# Patient Record
Sex: Female | Born: 1985 | Race: White | Hispanic: No | Smoking: Current every day smoker
Health system: Southern US, Community
[De-identification: ages and names within clinical notes are randomized; demographics above are authoritative.]

## PROBLEM LIST (undated history)

## (undated) DIAGNOSIS — R87619 Unspecified abnormal cytological findings in specimens from cervix uteri: Secondary | ICD-10-CM

## (undated) DIAGNOSIS — F32A Depression, unspecified: Secondary | ICD-10-CM

## (undated) DIAGNOSIS — R112 Nausea with vomiting, unspecified: Secondary | ICD-10-CM

## (undated) DIAGNOSIS — IMO0002 Reserved for concepts with insufficient information to code with codable children: Secondary | ICD-10-CM

## (undated) DIAGNOSIS — F329 Major depressive disorder, single episode, unspecified: Secondary | ICD-10-CM

## (undated) DIAGNOSIS — Z8759 Personal history of other complications of pregnancy, childbirth and the puerperium: Secondary | ICD-10-CM

## (undated) DIAGNOSIS — Z8742 Personal history of other diseases of the female genital tract: Secondary | ICD-10-CM

## (undated) DIAGNOSIS — Z8619 Personal history of other infectious and parasitic diseases: Secondary | ICD-10-CM

## (undated) DIAGNOSIS — Z9889 Other specified postprocedural states: Secondary | ICD-10-CM

## (undated) HISTORY — PX: WISDOM TOOTH EXTRACTION: SHX21

## (undated) HISTORY — PX: EYE SURGERY: SHX253

## (undated) HISTORY — DX: Personal history of other diseases of the female genital tract: Z87.42

## (undated) HISTORY — DX: Personal history of other complications of pregnancy, childbirth and the puerperium: Z87.59

## (undated) HISTORY — DX: Personal history of other infectious and parasitic diseases: Z86.19

---
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
## 2003-04-30 ENCOUNTER — Emergency Department (HOSPITAL_COMMUNITY): Admission: EM | Admit: 2003-04-30 | Discharge: 2003-04-30 | Payer: Self-pay | Admitting: Emergency Medicine

## 2003-10-28 ENCOUNTER — Emergency Department (HOSPITAL_COMMUNITY): Admission: EM | Admit: 2003-10-28 | Discharge: 2003-10-28 | Payer: Self-pay | Admitting: Emergency Medicine

## 2006-12-07 ENCOUNTER — Emergency Department (HOSPITAL_COMMUNITY): Admission: EM | Admit: 2006-12-07 | Discharge: 2006-12-07 | Payer: Self-pay | Admitting: Emergency Medicine

## 2009-06-23 ENCOUNTER — Inpatient Hospital Stay (HOSPITAL_COMMUNITY): Admission: AD | Admit: 2009-06-23 | Discharge: 2009-06-23 | Payer: Self-pay | Admitting: Obstetrics and Gynecology

## 2009-07-28 ENCOUNTER — Inpatient Hospital Stay (HOSPITAL_COMMUNITY): Admission: AD | Admit: 2009-07-28 | Discharge: 2009-07-29 | Payer: Self-pay | Admitting: Obstetrics and Gynecology

## 2009-09-10 ENCOUNTER — Inpatient Hospital Stay (HOSPITAL_COMMUNITY): Admission: AD | Admit: 2009-09-10 | Discharge: 2009-09-10 | Payer: Self-pay | Admitting: Obstetrics and Gynecology

## 2009-09-10 ENCOUNTER — Inpatient Hospital Stay (HOSPITAL_COMMUNITY): Admission: AD | Admit: 2009-09-10 | Discharge: 2009-09-12 | Payer: Self-pay | Admitting: Obstetrics and Gynecology

## 2010-05-30 LAB — CBC
HCT: 32.8 % — ABNORMAL LOW (ref 36.0–46.0)
HCT: 40.1 % (ref 36.0–46.0)
Hemoglobin: 11.6 g/dL — ABNORMAL LOW (ref 12.0–15.0)
Hemoglobin: 14 g/dL (ref 12.0–15.0)
MCH: 38.2 pg — ABNORMAL HIGH (ref 26.0–34.0)
MCH: 37.5 pg — ABNORMAL HIGH (ref 26.0–34.0)
MCHC: 35.5 g/dL (ref 30.0–36.0)
MCHC: 35 g/dL (ref 30.0–36.0)
MCV: 107.6 fL — ABNORMAL HIGH (ref 78.0–100.0)
MCV: 107.1 fL — ABNORMAL HIGH (ref 78.0–100.0)
Platelets: 170 K/uL (ref 150–400)
Platelets: 204 K/uL (ref 150–400)
RBC: 3.05 MIL/uL — ABNORMAL LOW (ref 3.87–5.11)
RBC: 3.74 MIL/uL — ABNORMAL LOW (ref 3.87–5.11)
RDW: 12.8 % (ref 11.5–15.5)
RDW: 12.6 % (ref 11.5–15.5)
WBC: 8.8 K/uL (ref 4.0–10.5)
WBC: 10.9 K/uL — ABNORMAL HIGH (ref 4.0–10.5)

## 2010-08-14 IMAGING — US US OB COMP +14 WK
1 series · 14 of 28 positions shown · non-contrast
Comparison: none

OBSTETRICAL ULTRASOUND:
 This ultrasound exam was performed in the [HOSPITAL] Ultrasound Department.  The OB US report was generated in the AS system, and faxed to the ordering physician.  This report is also available in [HOSPITAL]?s AccessANYware and in [REDACTED] PACS.

[Series 1: us ob comp +14 wk · 0.22mm/px · 14 of 66 slices shown]
[im 3/66]
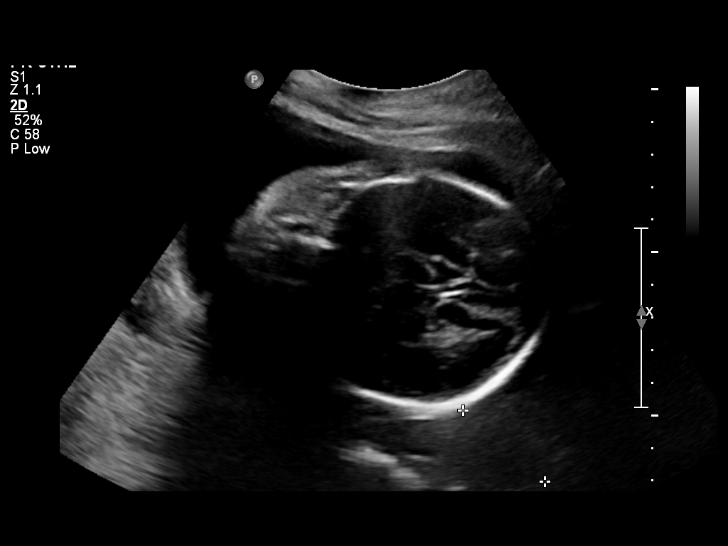
[im 8/66]
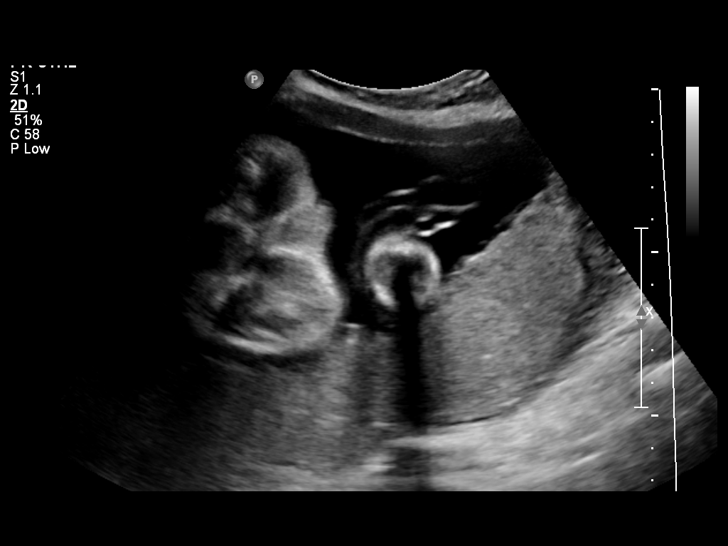
[im 13/66]
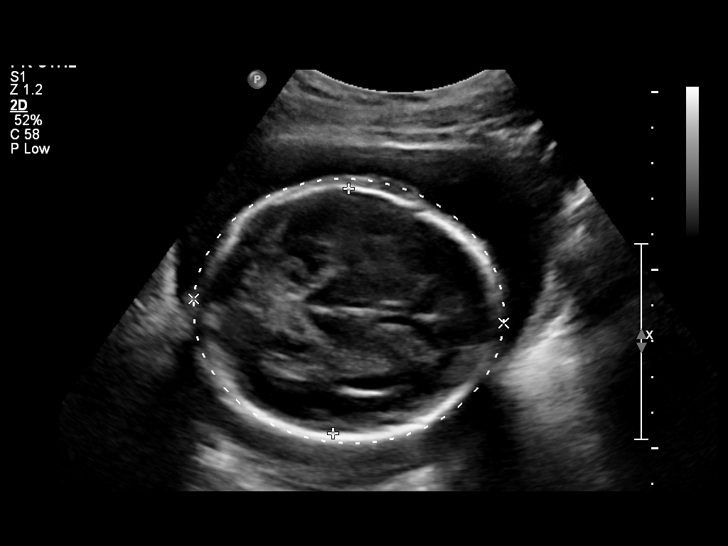
[im 17/66]
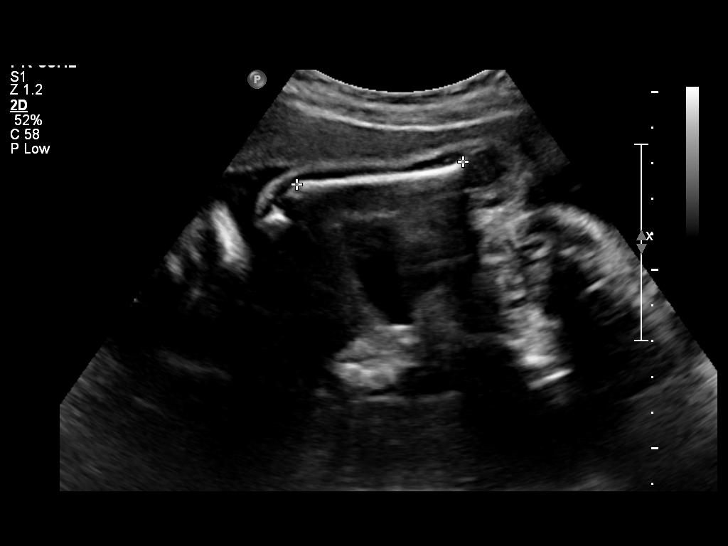
[im 22/66]
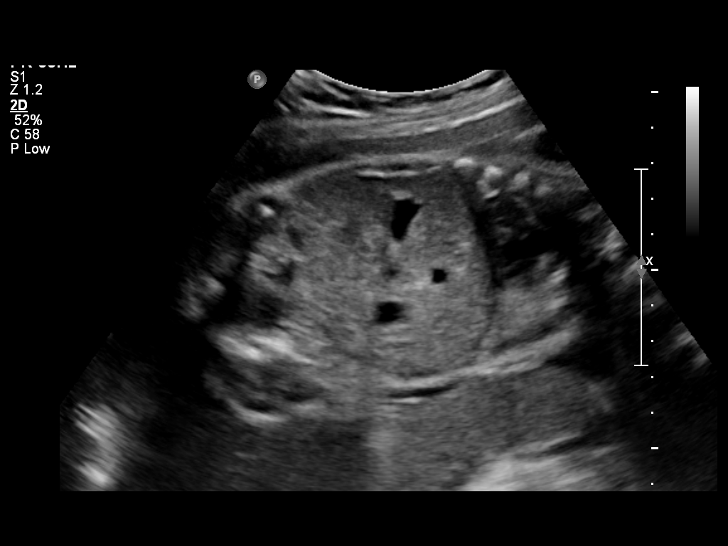
[im 27/66]
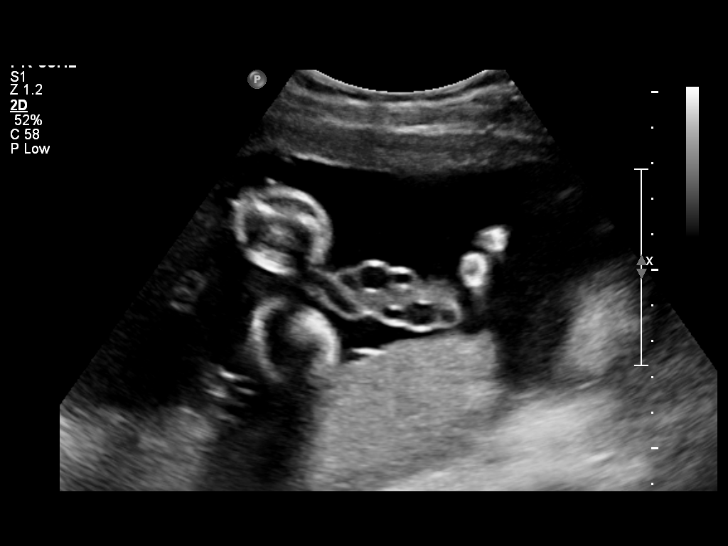
[im 32/66]
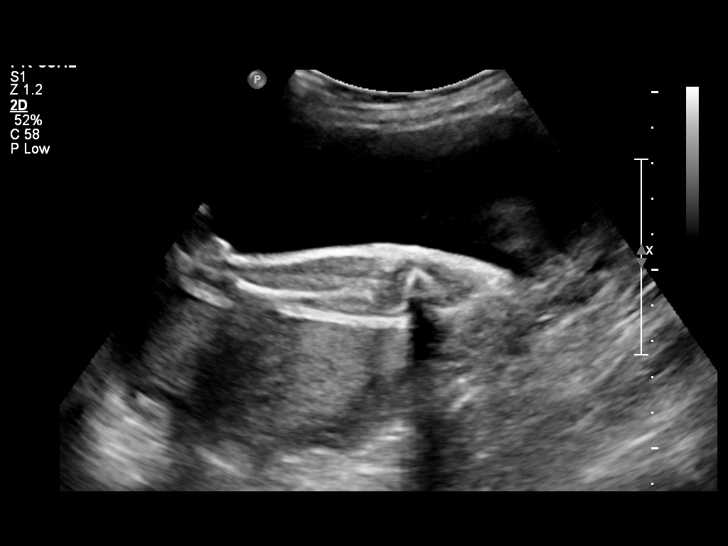
[im 37/66]
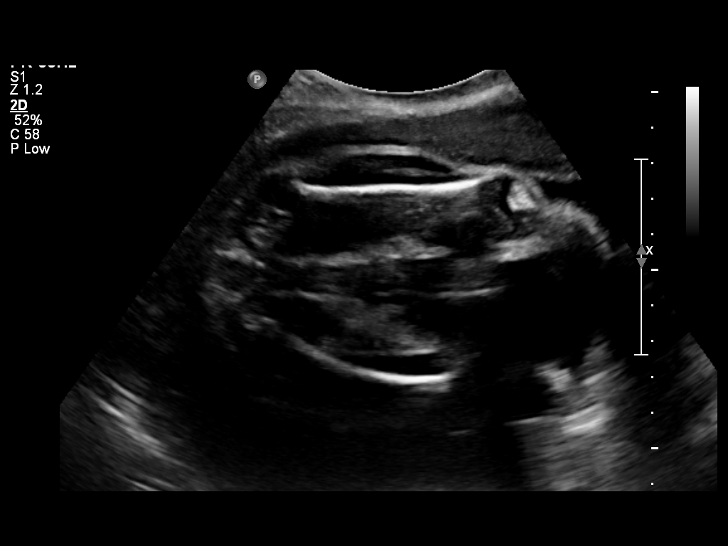
[im 41/66]
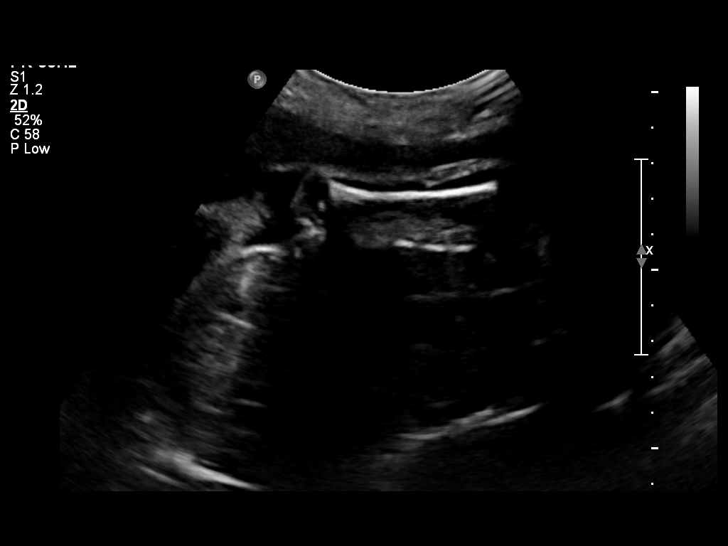
[im 46/66]
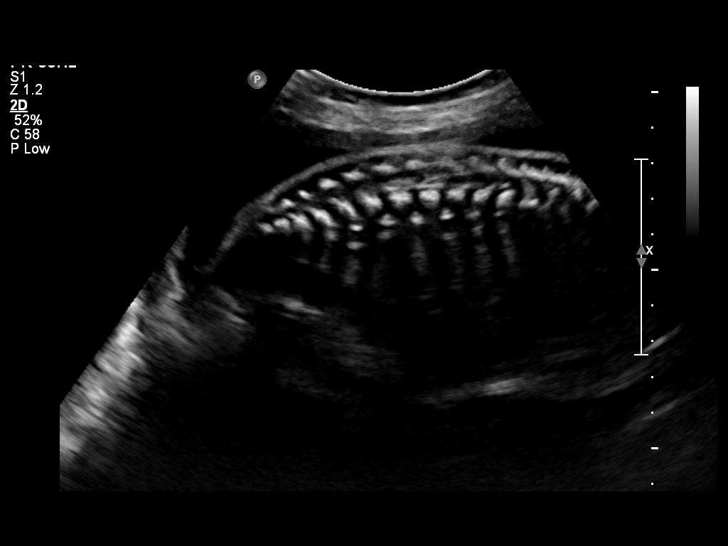
[im 51/66]
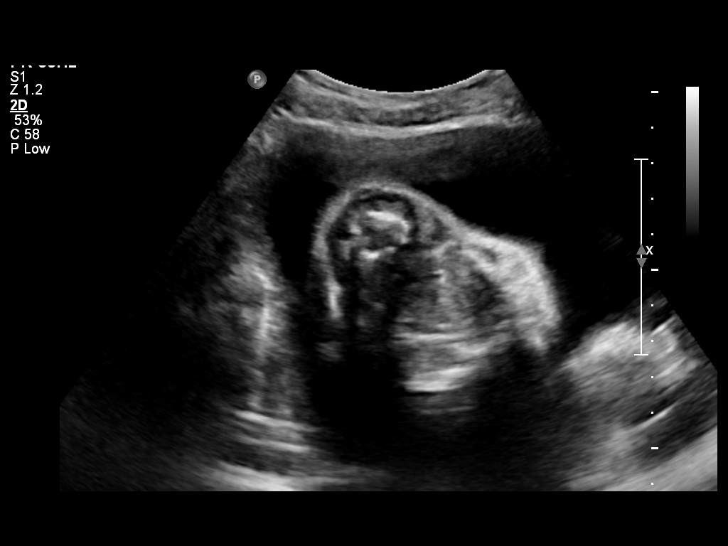
[im 56/66]
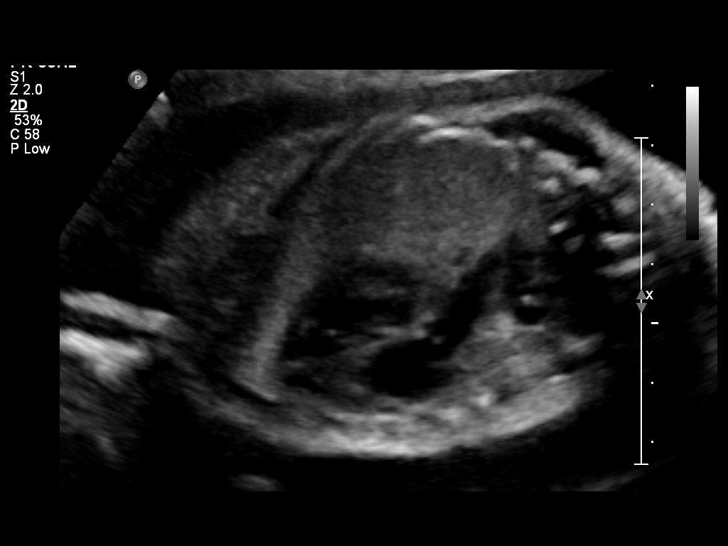
[im 61/66]
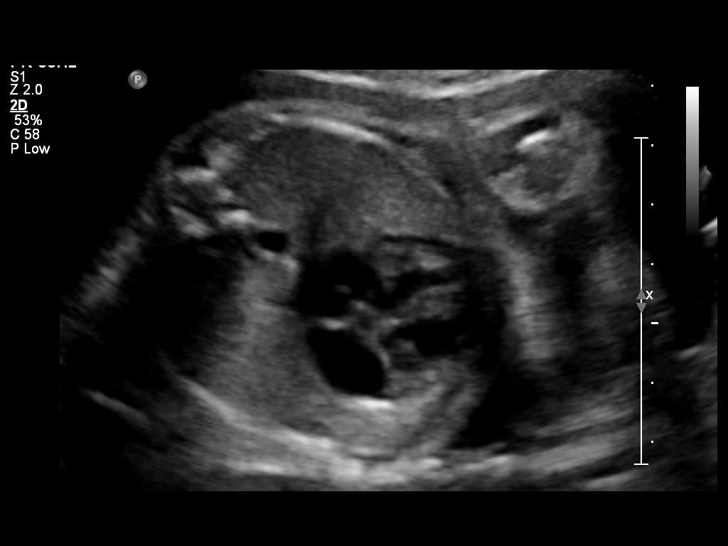
[im 66/66]
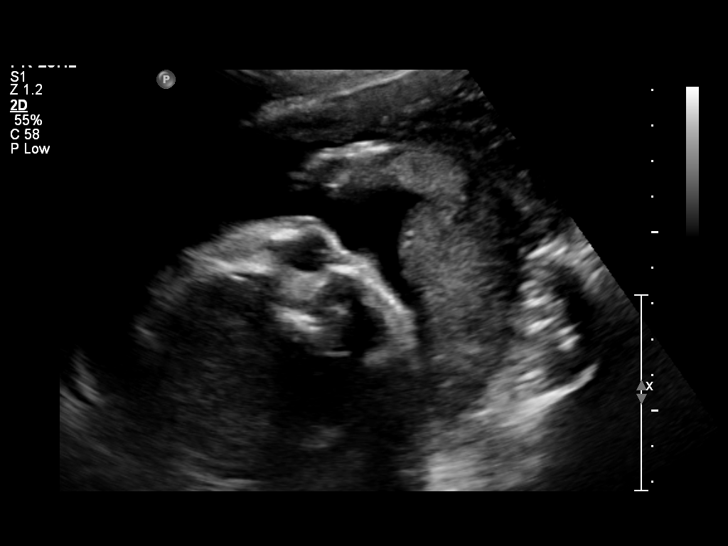

[14 of 28 positions shown; findings below may reference images not displayed]

IMPRESSION: See AS Obstetric US report.

## 2011-01-17 ENCOUNTER — Encounter (HOSPITAL_COMMUNITY): Payer: Self-pay | Admitting: *Deleted

## 2011-01-17 ENCOUNTER — Inpatient Hospital Stay (HOSPITAL_COMMUNITY)
Admission: AD | Admit: 2011-01-17 | Discharge: 2011-01-17 | Disposition: A | Discharge status: Home / Self Care | Payer: Medicaid Other | Source: Ambulatory Visit | Attending: Obstetrics and Gynecology | Admitting: Obstetrics and Gynecology

## 2011-01-17 DIAGNOSIS — O00109 Unspecified tubal pregnancy without intrauterine pregnancy: Secondary | ICD-10-CM | POA: Insufficient documentation

## 2011-01-17 DIAGNOSIS — O009 Unspecified ectopic pregnancy without intrauterine pregnancy: Secondary | ICD-10-CM | POA: Diagnosis present

## 2011-01-17 HISTORY — DX: Reserved for concepts with insufficient information to code with codable children: IMO0002

## 2011-01-17 HISTORY — DX: Unspecified abnormal cytological findings in specimens from cervix uteri: R87.619

## 2011-01-17 HISTORY — DX: Major depressive disorder, single episode, unspecified: F32.9

## 2011-01-17 HISTORY — DX: Depression, unspecified: F32.A

## 2011-01-17 LAB — COMPREHENSIVE METABOLIC PANEL
Albumin: 4.2 g/dL (ref 3.5–5.2)
Alkaline Phosphatase: 61 U/L (ref 39–117)
BUN: 8 mg/dL (ref 6–23)
Chloride: 101 mEq/L (ref 96–112)
GFR calc Af Amer: >90 mL/min (ref >90)
Glucose, Bld: 93 mg/dL (ref 70–99)
Potassium: 4.1 mEq/L (ref 3.5–5.1)
Total Bilirubin: 0.2 mg/dL — ABNORMAL LOW (ref 0.3–1.2)

## 2011-01-17 LAB — CBC
HCT: 37.6 % (ref 36.0–46.0)
Hemoglobin: 12.8 g/dL (ref 12.0–15.0)
MCV: 100 fL (ref 78.0–100.0)
RBC: 3.76 MIL/uL — ABNORMAL LOW (ref 3.87–5.11)
WBC: 9 10*3/uL (ref 4.0–10.5)

## 2011-01-17 MED ORDER — OXYCODONE-ACETAMINOPHEN 5-325 MG PO TABS: 2.0000 | ORAL_TABLET | ORAL | Status: DC | PRN

## 2011-01-17 MED ORDER — OXYCODONE-ACETAMINOPHEN 5-325 MG PO TABS
1.0000 | ORAL_TABLET | Freq: Once | ORAL | Status: DC
  Filled 2011-01-17: qty 1

## 2011-01-17 MED ORDER — METHOTREXATE INJECTION FOR WOMEN'S HOSPITAL
50.0000 mg/m2 | Freq: Once | INTRAMUSCULAR | Status: AC
  Administered 2011-01-17: 75 mg via INTRAMUSCULAR
  Filled 2011-01-17: qty 1.5

## 2011-01-17 NOTE — Progress Notes (Signed)
Pt sent here from office after dx with an ectopic on right tube.  Denies pain currently,  Has brown/red spotting.

## 2011-01-17 NOTE — Progress Notes (Signed)
Bleeding in preg, past wk.  Mild pain off and on, cramping after nurses son.  Pain is now in low back and rt lower front.  Sent from office,  Told ectopic on [place]

## 2011-01-17 NOTE — ED Provider Notes (Signed)
History     Chief Complaint  Patient presents with  . Vaginal Bleeding   Vaginal Bleeding The patient's primary symptoms include pelvic pain and vaginal bleeding. This is a new problem. The current episode started in the past 7 days. The problem occurs intermittently. The problem has been gradually worsening. The pain is moderate. The problem affects the right side. She is pregnant. Associated symptoms include back pain. The vaginal discharge was brown. The vaginal bleeding is spotting. She has not been passing clots. She has not been passing tissue. The symptoms are aggravated by nothing. She has tried NSAIDs for the symptoms. The treatment provided no relief. She uses nothing for contraception. Her past medical history is significant for a Cesarean section.   The patient was seen in the office today for these complaints. Quantitative hCG at 1232. Transvaginal ultrasound revealed a right sided ectopic pregnancy with identified gestational sac and yolk sac measuring. Overall measuring 1.7 cm x 1.2 cm.    Past Medical History  Diagnosis Date  . Depression   . Abnormal Pap smear     Past Surgical History  Procedure Date  . Cesarean section   . Eye surgery   . Wisdom tooth extraction     Family History  Problem Relation Age of Onset  . Cancer Paternal Uncle   . Cancer Maternal Grandmother   . Cancer Maternal Grandfather   . Diabetes Paternal Grandmother   . Heart disease Paternal Grandmother   . Hypertension Paternal Grandmother   . COPD Paternal Grandmother     History  Substance Use Topics  . Smoking status: Current Everyday Smoker -- 1.0 packs/day for 14 years    Types: Cigarettes  . Smokeless tobacco: Not on file  . Alcohol Use: Yes     4 yrs ago    Allergies:  Allergies  Allergen Reactions  . Penicillins Other (See Comments)    Happened in childhood, reaction unknown    Prescriptions prior to admission  Medication Sig Dispense Refill  . acetaminophen  (TYLENOL) 325 MG tablet Take 650 mg by mouth every 6 (six) hours as needed. For pain       . prenatal vitamin w/FE, FA (PRENATAL 1 + 1) 27-1 MG TABS Take 1 tablet by mouth daily.          Review of Systems  Genitourinary: Positive for vaginal bleeding and pelvic pain.  Musculoskeletal: Positive for back pain.  All other systems reviewed and are negative.   Physical Exam   Blood pressure 106/54, pulse 96, temperature 99.1 F (37.3 C), temperature source Oral, resp. rate 16, height 5\' 2"  (1.575 m), weight 53.071 kg (117 lb), last menstrual period 12/09/2010, SpO2 97.00%.  Physical Exam  Constitutional: She is oriented to person, place, and time. She appears well-developed and well-nourished.  Cardiovascular: Normal rate, regular rhythm and normal heart sounds.   Respiratory: Breath sounds normal.  GI: Soft. Bowel sounds are normal. There is no tenderness. There is no rebound and no guarding.  Neurological: She is oriented to person, place, and time.  Psychiatric: Her mood appears anxious. She exhibits a depressed mood.    Pelvic exam performed in the office revealed tenderness in the right lower quadrant and was not repeated at this time.  MAU Course  Procedures  After reviewing treatment options with the patient and her husband, including Laparoscopic salpyngostomy and Methotrexate; reviewing risks and benefits as well as contra-indication to breastfeeding with Methotrexate, patient elects for Methotrexate. She understands there is a  6% failure of treatment, risk of rupture ( instructed to return with severe abdominal pain), need for quantitative testing on Day #4 and Day #7. She also understands that the half-life of Methotrexate is 10 hours and after review of literature ( which is limited) with pharmacist, we recommend pumping and dumping of breast milk for 72 hours after injection is received. Finally, she was informed of the risks of recurrent ectopic pregnancy at 25 %  Assessment  and Plan  Methotrexate 50 mg/m2 IM now. Quantitative hCG  01/20/11 and 01/23/11 Return to MAU for severe pain or heavy bleeding Percocet prescription given for pain.  Madelyn Tlatelpa A 01/17/2011, 6:01 PM

## 2011-01-17 NOTE — Progress Notes (Signed)
Informing pt and husband of possible options for the ectopic and explaining plan of care.  Dr. Estanislado Pandy  Is on her way to discuss plan with pt and husb.

## 2011-01-20 ENCOUNTER — Inpatient Hospital Stay (HOSPITAL_COMMUNITY)
Admission: AD | Admit: 2011-01-20 | Discharge: 2011-01-20 | Disposition: A | Discharge status: Home / Self Care | Payer: Medicaid Other | Source: Ambulatory Visit | Attending: Obstetrics and Gynecology | Admitting: Obstetrics and Gynecology

## 2011-01-20 ENCOUNTER — Encounter (HOSPITAL_COMMUNITY): Payer: Self-pay

## 2011-01-20 DIAGNOSIS — O00109 Unspecified tubal pregnancy without intrauterine pregnancy: Secondary | ICD-10-CM | POA: Insufficient documentation

## 2011-01-20 LAB — HCG, QUANTITATIVE, PREGNANCY: hCG, Beta Chain, Quant, S: 1345 m[IU]/mL — ABNORMAL HIGH (ref <5)

## 2011-01-20 NOTE — ED Notes (Signed)
Here for day 4 Methotrexate--very tearful.  Accompanied by husband. Small amount bleeding, mild cramping.  Using Percocet for pain, with benefit. Previous [facility] 1087 01/17/11  Results for orders placed during the hospital encounter of 01/20/11 (from the past 24 hour(s))  HCG, QUANTITATIVE, PREGNANCY     Status: Abnormal   Collection Time   01/20/11 11:22 AM      Component Value Range   hCG, Beta Chain, Quant, S 1345 (*) <5 (mIU/mL)   Reviewed findings with patient and husband, including discussion of grief process. Questions reviewed. Claybon Jabs, RN, reviewed results with Dr. Ivar Drape continue to observe and repeat [facility] on Sunday, 01/23/11. Patient may want to do follow-up QHCGs in office.  Will also continue discussion with patient regarding emotional status.  She declines anti-depressants meds at this time.  Support offered to patient and husband for issues. I will see patient on Sunday for repeat [facility] and discussion of status.  Nigel Bridgeman, CNM 01/20/11 1320

## 2011-01-20 NOTE — Progress Notes (Signed)
Patient to MAU for BHCG day 4 s/p MTX. States she has cramping off and on, none at this time. States she is bleeding like a period sometime bright red to brown.

## 2011-01-21 ENCOUNTER — Other Ambulatory Visit: Payer: Self-pay | Admitting: Obstetrics and Gynecology

## 2011-01-21 NOTE — ED Provider Notes (Signed)
Patient presented on 01/20/11 for repeat [facility], s/p MTX on 11/5 for ectopic pregnancy. Patient reports small amount bleeding, with mild cramping. Using Percocet for pain with benefit. Still breastfeeding 103 month old child. QHCG today 1345 (previous 1087 on 11/5) Patient tearful and grieving outcome of pregnancy. Denies need for anti-depressants at this time.  Plan: Support offered to patient for loss and issues. Repeat QHCG on Sunday, 01/23/11--will also assess patient's emotional status at that time. Reviewed QHCG with Dr. Normand Sloop. Patient to call with any increase in bleeding or pain, or other questions.  Nigel Bridgeman, CNM 01/21/11  1310

## 2011-01-23 ENCOUNTER — Inpatient Hospital Stay (HOSPITAL_COMMUNITY)
Admission: AD | Admit: 2011-01-23 | Discharge: 2011-01-23 | Disposition: A | Discharge status: Home / Self Care | Payer: Medicaid Other | Source: Ambulatory Visit | Attending: Obstetrics and Gynecology | Admitting: Obstetrics and Gynecology

## 2011-01-23 ENCOUNTER — Other Ambulatory Visit: Payer: Self-pay | Admitting: Obstetrics and Gynecology

## 2011-01-23 DIAGNOSIS — O00109 Unspecified tubal pregnancy without intrauterine pregnancy: Secondary | ICD-10-CM | POA: Insufficient documentation

## 2011-01-23 MED ORDER — OXYCODONE-ACETAMINOPHEN 5-325 MG PO TABS: 2.0000 | ORAL_TABLET | ORAL | Status: AC | PRN

## 2011-01-23 NOTE — Progress Notes (Signed)
Patient reports receiving Methotrexate on Monday here for f/u BHCG, having lower back pain which is mild, has been passing clots with vaginal bleeding since Monday.

## 2011-01-23 NOTE — ED Provider Notes (Signed)
History   Here for Methotrexate f/u--received on 01/17/11.  QHCG that day 1086, repeat 01/20/11 1387. Still struggling with grief and depression---declines anti-depressants or referral for counseling. Sporadic moderate bleeding and cramping--reported those as "small amount" to nurse.  CSN: [id number] Arrival date & time: 01/23/2011 10:39 AM   None     Chief Complaint  Patient presents with  . Vaginal Bleeding  . Abdominal Cramping    (Consider location/radiation/quality/duration/timing/severity/associated sxs/prior treatment) HPI  Past Medical History  Diagnosis Date  . Depression   . Abnormal Pap smear     Past Surgical History  Procedure Date  . Cesarean section   . Eye surgery   . Wisdom tooth extraction     Family History  Problem Relation Age of Onset  . Cancer Paternal Uncle   . Cancer Maternal Grandmother   . Cancer Maternal Grandfather   . Diabetes Paternal Grandmother   . Heart disease Paternal Grandmother   . Hypertension Paternal Grandmother   . COPD Paternal Grandmother     History  Substance Use Topics  . Smoking status: Current Everyday Smoker -- 1.0 packs/day for 14 years    Types: Cigarettes  . Smokeless tobacco: Not on file  . Alcohol Use: Yes     4 yrs ago    OB History    Grav Para Term Preterm Abortions TAB SAB Ect Mult Living   3 2 2       2       Review of Systems--deferred at present. PE deferred.  Allergies  Penicillins  Home Medications   Current Outpatient Rx  Name Route Sig Dispense Refill  . ACETAMINOPHEN 325 MG PO TABS Oral Take 650 mg by mouth every 6 (six) hours as needed. For pain     . OXYCODONE-ACETAMINOPHEN 5-325 MG PO TABS Oral Take 2 tablets by mouth every 4 (four) hours as needed for pain. 24 tablet 0  . PRENATAL PLUS 27-1 MG PO TABS Oral Take 1 tablet by mouth daily.        BP 123/57  Pulse 93  Temp 99 F (37.2 C)  Resp 16  LMP 12/09/2010  Physical Exam--PE deferred due to no changes in status from  last visit.  ED Course  Procedures (including critical care time)  Labs Reviewed  HCG, QUANTITATIVE, PREGNANCY - Abnormal; Notable for the following:    hCG, Beta Chain, Quant, S 509 (*)    All other components within normal limits     MDM  Ectopic pregnancy, s/p Methotrexate 01/17/11. Depression and grief, hx depression in past  Consulted with Dr. Mahalia Longest f/u [facility] in 1 week. Reviewed findings with patient and husband--patient denies SI/HI.  Commits to communicating with [place] if issues worsen. [facility] results reviewed, with plan made for repeat QHCG in 1 week at MAU (01/30/11), then will have f/u appointment the following week at [facility] to review status.  Office will call to schedule. Precautions reviewed with patient and husband. Patient requested refill on Percocet--Rx' 24 tabs with no refills.    Nigel Bridgeman, CNM 01/23/11  1210

## 2011-01-30 ENCOUNTER — Inpatient Hospital Stay (HOSPITAL_COMMUNITY)
Admission: AD | Admit: 2011-01-30 | Discharge: 2011-01-30 | Disposition: A | Discharge status: Home / Self Care | Payer: Medicaid Other | Source: Ambulatory Visit | Attending: Obstetrics and Gynecology | Admitting: Obstetrics and Gynecology

## 2011-01-30 DIAGNOSIS — O00109 Unspecified tubal pregnancy without intrauterine pregnancy: Secondary | ICD-10-CM | POA: Insufficient documentation

## 2011-01-30 MED ORDER — KETOROLAC TROMETHAMINE 10 MG PO TABS: 10.0000 mg | ORAL_TABLET | Freq: Four times a day (QID) | ORAL | Status: DC | PRN

## 2011-01-30 MED ORDER — KETOROLAC TROMETHAMINE 10 MG PO TABS: 10.0000 mg | ORAL_TABLET | Freq: Four times a day (QID) | ORAL | Status: AC | PRN

## 2011-01-30 NOTE — ED Notes (Signed)
[name] CNM into speak to patient about results.

## 2011-01-30 NOTE — Progress Notes (Signed)
Patient reports having brown bleeding now with cramping in am and pm here for follow up BHCG

## 2011-01-30 NOTE — Progress Notes (Signed)
Pt c/o cramping, says motrin did not help before, has some spotting and discharge  QHCG down to 37.  Has f/u appt on Tuesday  RX for toradol 10mg  PO q6h PRN

## 2011-11-29 ENCOUNTER — Ambulatory Visit: Payer: Medicaid Other | Admitting: Obstetrics and Gynecology

## 2012-03-08 ENCOUNTER — Telehealth: Payer: Self-pay | Admitting: Obstetrics and Gynecology

## 2012-03-08 NOTE — Telephone Encounter (Signed)
TC to pt. States LMP 01/23/12.  I shaving nausea, headaches and fatigue.  Occasionally vomiting.  Able to retain food and fluids. Discussed diet and comfort measures.  Is drinking sufficient water.  Pt declines Rx at this time.  To call if no improvement or desires RX or unable to retain food or fluid x 24 hours. Pt verbalizes comprehension.

## 2012-04-12 ENCOUNTER — Encounter: Payer: Medicaid Other | Admitting: Obstetrics and Gynecology

## 2014-01-13 ENCOUNTER — Encounter (HOSPITAL_COMMUNITY): Payer: Self-pay

## 2014-08-20 ENCOUNTER — Encounter (HOSPITAL_COMMUNITY): Payer: Self-pay

## 2014-08-20 ENCOUNTER — Inpatient Hospital Stay (HOSPITAL_COMMUNITY)
Admission: AD | Admit: 2014-08-20 | Discharge: 2014-08-20 | Disposition: A | Discharge status: Home / Self Care | Payer: Medicaid Other | Source: Ambulatory Visit | Attending: Obstetrics and Gynecology | Admitting: Obstetrics and Gynecology

## 2014-08-20 DIAGNOSIS — F17210 Nicotine dependence, cigarettes, uncomplicated: Secondary | ICD-10-CM | POA: Diagnosis not present

## 2014-08-20 DIAGNOSIS — N939 Abnormal uterine and vaginal bleeding, unspecified: Secondary | ICD-10-CM | POA: Insufficient documentation

## 2014-08-20 LAB — URINALYSIS, ROUTINE W REFLEX MICROSCOPIC
BILIRUBIN URINE: NEGATIVE
Glucose, UA: NEGATIVE mg/dL
KETONES UR: NEGATIVE mg/dL
LEUKOCYTES UA: NEGATIVE
NITRITE: NEGATIVE
Protein, ur: NEGATIVE mg/dL
SPECIFIC GRAVITY, URINE: 1.015 (ref 1.005–1.030)
UROBILINOGEN UA: 0.2 mg/dL (ref 0.0–1.0)
pH: 5.5 (ref 5.0–8.0)

## 2014-08-20 LAB — POCT PREGNANCY, URINE: PREG TEST UR: NEGATIVE

## 2014-08-20 LAB — URINE MICROSCOPIC-ADD ON

## 2014-08-20 MED ORDER — NORGESTIMATE-ETH ESTRADIOL 0.25-35 MG-MCG PO TABS: 1.0000 | ORAL_TABLET | Freq: Every day | ORAL | Status: DC

## 2014-08-20 NOTE — MAU Note (Signed)
Patient states heavy bleeding with passing clots for 8 days, period was supposed to start 08/17/14, changing pads every two hours, negative home pregnancy, on BCP (mini pill).

## 2014-08-20 NOTE — Discharge Instructions (Signed)
Abnormal Uterine Bleeding Abnormal uterine bleeding can affect women at various stages in life, including teenagers, women in their reproductive years, pregnant women, and women who have reached menopause. Several kinds of uterine bleeding are considered abnormal, including:  Bleeding or spotting between periods.   Bleeding after sexual intercourse.   Bleeding that is heavier or more than normal.   Periods that last longer than usual.  Bleeding after menopause.  Many cases of abnormal uterine bleeding are minor and simple to treat, while others are more serious. Any type of abnormal bleeding should be evaluated by your health care provider. Treatment will depend on the cause of the bleeding. HOME CARE INSTRUCTIONS Monitor your condition for any changes. The following actions may help to alleviate any discomfort you are experiencing:  Avoid the use of tampons and douches as directed by your health care provider.  Change your pads frequently. You should get regular pelvic exams and Pap tests. Keep all follow-up appointments for diagnostic tests as directed by your health care provider.  SEEK MEDICAL CARE IF:   Your bleeding lasts more than 1 week.   You feel dizzy at times.  SEEK IMMEDIATE MEDICAL CARE IF:   You pass out.   You are changing pads every 15 to 30 minutes.   You have abdominal pain.  You have a fever.   You become sweaty or weak.   You are passing large blood clots from the vagina.   You start to feel nauseous and vomit. MAKE SURE YOU:   Understand these instructions.  Will watch your condition.  Will get help right away if you are not doing well or get worse. Document Released: 02/28/2005 Document Revised: 03/05/2013 Document Reviewed: 09/27/2012 ExitCare Patient Information 2015 [employer]. This information is not intended to replace advice given to you by your health care provider. Make sure you discuss any questions you have with your  health care provider.  

## 2014-08-20 NOTE — MAU Provider Note (Signed)
History     CSN: [id number]  Arrival date and time: 08/20/14 1422   First Provider Initiated Contact with Patient 08/20/14 1521      Chief Complaint  Patient presents with  . Vaginal Bleeding   HPI   Ms. Jean Jensen is a 29 y.o. female [phone] who presents with heavy vaginal bleeding. She was due to start her menstrual cycle on June 5th; she started her menstrual on June 1st and has had heavy bleeding since then. Her periods are normally 5-6 days, she is concerned because today is day 8 and she continues to have heavy bleeding. She feels that the color is changing to dark red, however she is still having to wear a pad.   She has been on the mini pill for birth control for about 1 year. She started this while she was breastfeeding. She is no longer breastfeeding. She denies history of PE or DVT. She is an everyday smoker.   OB History    Gravida Para Term Preterm AB TAB SAB Ectopic Multiple Living   5 3 2  1  1   3       Past Medical History  Diagnosis Date  . Depression   . Abnormal Pap smear     Past Surgical History  Procedure Laterality Date  . Cesarean section    . Eye surgery    . Wisdom tooth extraction      Family History  Problem Relation Age of Onset  . Cancer Paternal Uncle   . Cancer Maternal Grandmother   . Cancer Maternal Grandfather   . Diabetes Paternal Grandmother   . Heart disease Paternal Grandmother   . Hypertension Paternal Grandmother   . COPD Paternal Grandmother     History  Substance Use Topics  . Smoking status: Current Every Day Smoker -- 1.00 packs/day for 14 years    Types: Cigarettes  . Smokeless tobacco: Not on file  . Alcohol Use: Yes     Comment: 4 yrs ago    Allergies:  Allergies  Allergen Reactions  . Penicillins Other (See Comments)    Happened in childhood, reaction unknown    No prescriptions prior to admission   Results for orders placed or performed during the hospital encounter of 08/20/14 (from the past 48  hour(s))  Urinalysis, Routine w reflex microscopic (not at [facility])     Status: Abnormal   Collection Time: 08/20/14  3:02 PM  Result Value Ref Range   Color, Urine YELLOW YELLOW   APPearance CLEAR CLEAR   Specific Gravity, Urine 1.015 1.005 - 1.030   pH 5.5 5.0 - 8.0   Glucose, UA NEGATIVE NEGATIVE mg/dL   Hgb urine dipstick MODERATE (A) NEGATIVE   Bilirubin Urine NEGATIVE NEGATIVE   Ketones, ur NEGATIVE NEGATIVE mg/dL   Protein, ur NEGATIVE NEGATIVE mg/dL   Urobilinogen, UA 0.2 0.0 - 1.0 mg/dL   Nitrite NEGATIVE NEGATIVE   Leukocytes, UA NEGATIVE NEGATIVE  Urine microscopic-add on     Status: None   Collection Time: 08/20/14  3:02 PM  Result Value Ref Range   Squamous Epithelial / LPF RARE RARE   WBC, UA 0-2 <3 WBC/hpf   RBC / HPF 3-6 <3 RBC/hpf   Bacteria, UA RARE RARE  Pregnancy, urine POC     Status: None   Collection Time: 08/20/14  3:04 PM  Result Value Ref Range   Preg Test, Ur NEGATIVE NEGATIVE    Comment:        THE SENSITIVITY  OF THIS METHODOLOGY IS >24 mIU/mL     Review of Systems  Constitutional: Negative for fever and chills.  Gastrointestinal: Negative for abdominal pain.  Genitourinary:       Heavy vaginal bleeding   Neurological: Negative for dizziness.   Physical Exam   Blood pressure 122/72, pulse 81, temperature 98.4 F (36.9 C), resp. rate 16.  Physical Exam  Constitutional: She is oriented to person, place, and time. She appears well-developed and well-nourished. No distress.  HENT:  Head: Normocephalic.  Eyes: Pupils are equal, round, and reactive to light.  Neck: Neck supple.  GI: Soft.  Genitourinary:  Bimanual exam: Cervix closed, anterior  Small amount of dark red blood noted on exam glove.  Uterus non tender, normal size Adnexa non tender, no masses bilaterally Chaperone present for exam.  Musculoskeletal: Normal range of motion.  Neurological: She is alert and oriented to person, place, and time.  Skin: Skin is warm. She is not  diaphoretic.  Psychiatric: Her behavior is normal.    MAU Course  Procedures  None  MDM   Assessment and Plan   A:  1. Abnormal vaginal bleeding     P:  Discharge home in stable condition Stop micronor RX: sprintec. Take 2 pills per day for 7 days then 1 pill per day Follow up with GYN dr. As needed Bleeding precautions    Duane Lope, NP 08/20/2014 5:09 PM

## 2014-08-31 ENCOUNTER — Encounter (HOSPITAL_COMMUNITY): Payer: Self-pay

## 2014-08-31 ENCOUNTER — Inpatient Hospital Stay (HOSPITAL_COMMUNITY)
Admission: AD | Admit: 2014-08-31 | Discharge: 2014-08-31 | Disposition: A | Discharge status: Home / Self Care | Payer: Medicaid Other | Source: Ambulatory Visit | Attending: Obstetrics & Gynecology | Admitting: Obstetrics & Gynecology

## 2014-08-31 DIAGNOSIS — Z880 Allergy status to penicillin: Secondary | ICD-10-CM | POA: Insufficient documentation

## 2014-08-31 DIAGNOSIS — F17210 Nicotine dependence, cigarettes, uncomplicated: Secondary | ICD-10-CM | POA: Insufficient documentation

## 2014-08-31 DIAGNOSIS — N939 Abnormal uterine and vaginal bleeding, unspecified: Secondary | ICD-10-CM | POA: Diagnosis not present

## 2014-08-31 LAB — URINALYSIS, ROUTINE W REFLEX MICROSCOPIC
BILIRUBIN URINE: NEGATIVE
Glucose, UA: NEGATIVE mg/dL
Ketones, ur: 15 mg/dL — AB
NITRITE: NEGATIVE
Protein, ur: 100 mg/dL — AB
Specific Gravity, Urine: 1.015 (ref 1.005–1.030)
Urobilinogen, UA: 0.2 mg/dL (ref 0.0–1.0)
pH: 6.5 (ref 5.0–8.0)

## 2014-08-31 LAB — WET PREP, GENITAL
Trich, Wet Prep: NONE SEEN
Yeast Wet Prep HPF POC: NONE SEEN

## 2014-08-31 LAB — CBC
HCT: 36.3 % (ref 36.0–46.0)
Hemoglobin: 12.6 g/dL (ref 12.0–15.0)
MCH: 35.7 pg — ABNORMAL HIGH (ref 26.0–34.0)
MCHC: 34.7 g/dL (ref 30.0–36.0)
MCV: 102.8 fL — ABNORMAL HIGH (ref 78.0–100.0)
Platelets: 253 10*3/uL (ref 150–400)
RBC: 3.53 MIL/uL — ABNORMAL LOW (ref 3.87–5.11)
RDW: 12.4 % (ref 11.5–15.5)
WBC: 11.4 10*3/uL — ABNORMAL HIGH (ref 4.0–10.5)

## 2014-08-31 LAB — URINE MICROSCOPIC-ADD ON

## 2014-08-31 LAB — POCT PREGNANCY, URINE: PREG TEST UR: NEGATIVE

## 2014-08-31 MED ORDER — NAPROXEN 500 MG PO TABS: 500.0000 mg | ORAL_TABLET | Freq: Two times a day (BID) | ORAL | Status: DC

## 2014-08-31 NOTE — Discharge Instructions (Signed)

## 2014-08-31 NOTE — MAU Note (Signed)
After last MAU visit, bleeding stopped for 5 days. Vaginal bleeding started again last Thursday. Reports abdominal cramping and large clots. Has been taking Sprintec as prescribed last visit.

## 2014-08-31 NOTE — MAU Provider Note (Signed)
CSN: [id number]     Arrival date & time 08/31/14  2220 History   None    Chief Complaint  Patient presents with  . Vaginal Bleeding     (Consider location/radiation/quality/duration/timing/severity/associated sxs/prior Treatment) Patient is a 29 y.o. female presenting with vaginal bleeding. The history is provided by the patient.  Vaginal Bleeding This is a recurrent problem. The problem occurs constantly. The problem has been gradually worsening.   Jean Jensen is a 29 y.o. [phone] who presents to the MAU with vaginal bleeding. She was here 6/8 with concern for vaginal bleeding. She was started on OC's and stopped bleeding until 4 days ago. She reports bleeding heavy despite taking the pills.   Past Medical History  Diagnosis Date  . Depression   . Abnormal Pap smear    Past Surgical History  Procedure Laterality Date  . Cesarean section    . Eye surgery    . Wisdom tooth extraction     Family History  Problem Relation Age of Onset  . Cancer Paternal Uncle   . Cancer Maternal Grandmother   . Cancer Maternal Grandfather   . Diabetes Paternal Grandmother   . Heart disease Paternal Grandmother   . Hypertension Paternal Grandmother   . COPD Paternal Grandmother    History  Substance Use Topics  . Smoking status: Current Every Day Smoker -- 1.00 packs/day for 14 years    Types: Cigarettes  . Smokeless tobacco: Not on file  . Alcohol Use: Yes     Comment: 4 yrs ago   OB History    Gravida Para Term Preterm AB TAB SAB Ectopic Multiple Living   5 3 2  1  1   3      Review of Systems  Genitourinary: Positive for vaginal bleeding.  all other systems negative    Allergies  Penicillins  Home Medications   Prior to Admission medications   Medication Sig Start Date End Date Taking? Authorizing Provider  fexofenadine (ALLEGRA ALLERGY) 60 MG tablet Take 60 mg by mouth 2 (two) times daily.    Historical Provider, MD  naproxen (NAPROSYN) 500 MG tablet Take 1 tablet (500  mg total) by mouth 2 (two) times daily. 08/31/14   Hope Orlene Och, NP  norgestimate-ethinyl estradiol (ORTHO-CYCLEN,SPRINTEC,PREVIFEM) 0.25-35 MG-MCG tablet Take 1 tablet by mouth daily. Take 2 tablets daily for 7 days then 1 pill daily. 08/20/14   Duane Lope, NP  OVER THE COUNTER MEDICATION Take 1 tablet by mouth daily. Takes a B vitamin    Historical Provider, MD  PRESCRIPTION MEDICATION Inject 1 each into the skin once a week. Pt gets allergy injection at dr's office weekly    Historical Provider, MD   BP 115/69 mmHg  Pulse 81  Temp(Src) 98.3 F (36.8 C) (Oral)  Resp 16  Ht 5\' 3"  (1.6 m)  Wt 117 lb 9.6 oz (53.343 kg)  BMI 20.84 kg/m2  SpO2 100%  LMP 08/26/2014  Breastfeeding? Unknown Physical Exam  Constitutional: She is oriented to person, place, and time. She appears well-developed and well-nourished. No distress.  HENT:  Head: Normocephalic.  Eyes: EOM are normal.  Neck: Neck supple.  Cardiovascular: Normal rate.   Pulmonary/Chest: Effort normal.  Abdominal: Soft. There is no tenderness.  Genitourinary:  External genitalia without lesions. Small blood vaginal vault. Cervix closed, no CMT, no adnexal tenderness or mass palpated, uterus without palpable enlargement.   Musculoskeletal: Normal range of motion.  Neurological: She is alert and oriented to person, place, and  time. No cranial nerve deficit.  Skin: Skin is warm and dry.  Psychiatric: She has a normal mood and affect. Her behavior is normal.  Nursing note and vitals reviewed.   ED Course  Procedures (including critical care time) Labs Review Results for orders placed or performed during the hospital encounter of 08/31/14 (from the past 24 hour(s))  Urinalysis, Routine w reflex microscopic (not at [facility])     Status: Abnormal   Collection Time: 08/31/14 10:30 PM  Result Value Ref Range   Color, Urine RED (A) YELLOW   APPearance HAZY (A) CLEAR   Specific Gravity, Urine 1.015 1.005 - 1.030   pH 6.5 5.0 - 8.0    Glucose, UA NEGATIVE NEGATIVE mg/dL   Hgb urine dipstick LARGE (A) NEGATIVE   Bilirubin Urine NEGATIVE NEGATIVE   Ketones, ur 15 (A) NEGATIVE mg/dL   Protein, ur 914 (A) NEGATIVE mg/dL   Urobilinogen, UA 0.2 0.0 - 1.0 mg/dL   Nitrite NEGATIVE NEGATIVE   Leukocytes, UA SMALL (A) NEGATIVE  Urine microscopic-add on     Status: None   Collection Time: 08/31/14 10:30 PM  Result Value Ref Range   RBC / HPF TOO NUMEROUS TO COUNT <3 RBC/hpf  Pregnancy, urine POC     Status: None   Collection Time: 08/31/14 10:37 PM  Result Value Ref Range   Preg Test, Ur NEGATIVE NEGATIVE  CBC     Status: Abnormal   Collection Time: 08/31/14 10:51 PM  Result Value Ref Range   WBC 11.4 (H) 4.0 - 10.5 K/uL   RBC 3.53 (L) 3.87 - 5.11 MIL/uL   Hemoglobin 12.6 12.0 - 15.0 g/dL   HCT 78.2 95.6 - 21.3 %   MCV 102.8 (H) 78.0 - 100.0 fL   MCH 35.7 (H) 26.0 - 34.0 pg   MCHC 34.7 30.0 - 36.0 g/dL   RDW 08.6 57.8 - 46.9 %   Platelets 253 150 - 400 K/uL  Wet prep, genital     Status: Abnormal   Collection Time: 08/31/14 11:03 PM  Result Value Ref Range   Yeast Wet Prep HPF POC NONE SEEN NONE SEEN   Trich, Wet Prep NONE SEEN NONE SEEN   Clue Cells Wet Prep HPF POC FEW (A) NONE SEEN   WBC, Wet Prep HPF POC FEW (A) NONE SEEN    Discussed with Dr. Macon Large  MDM  29 y.o. female with vaginal bleeding while taking OC's. Stable for d/c and is not orthostatic and Hgb. 12.6 and no heavy bleeding.  She will continue OC's and follow up in the GYN Clinic. She will return here for worsening symptoms. Discussed with the patient clinical and lab findings and plan of care. All questioned fully answered.   Final diagnoses:  Abnormal vaginal bleeding

## 2014-09-01 LAB — GC/CHLAMYDIA PROBE AMP (~~LOC~~) NOT AT ARMC
Chlamydia: NEGATIVE
NEISSERIA GONORRHEA: NEGATIVE

## 2015-10-16 ENCOUNTER — Inpatient Hospital Stay (HOSPITAL_COMMUNITY)
Admission: AD | Admit: 2015-10-16 | Discharge: 2015-10-16 | Disposition: A | Discharge status: Home / Self Care | Payer: Medicaid Other | Source: Ambulatory Visit | Attending: Obstetrics & Gynecology | Admitting: Obstetrics & Gynecology

## 2015-10-16 ENCOUNTER — Encounter (HOSPITAL_COMMUNITY): Payer: Self-pay | Admitting: *Deleted

## 2015-10-16 DIAGNOSIS — R109 Unspecified abdominal pain: Secondary | ICD-10-CM | POA: Diagnosis not present

## 2015-10-16 DIAGNOSIS — N760 Acute vaginitis: Secondary | ICD-10-CM | POA: Diagnosis not present

## 2015-10-16 DIAGNOSIS — Z880 Allergy status to penicillin: Secondary | ICD-10-CM | POA: Insufficient documentation

## 2015-10-16 DIAGNOSIS — Z79899 Other long term (current) drug therapy: Secondary | ICD-10-CM | POA: Diagnosis not present

## 2015-10-16 DIAGNOSIS — F329 Major depressive disorder, single episode, unspecified: Secondary | ICD-10-CM | POA: Diagnosis not present

## 2015-10-16 DIAGNOSIS — F17210 Nicotine dependence, cigarettes, uncomplicated: Secondary | ICD-10-CM | POA: Insufficient documentation

## 2015-10-16 DIAGNOSIS — B9689 Other specified bacterial agents as the cause of diseases classified elsewhere: Secondary | ICD-10-CM

## 2015-10-16 DIAGNOSIS — N939 Abnormal uterine and vaginal bleeding, unspecified: Secondary | ICD-10-CM | POA: Diagnosis not present

## 2015-10-16 LAB — URINALYSIS, ROUTINE W REFLEX MICROSCOPIC
BILIRUBIN URINE: NEGATIVE
Glucose, UA: NEGATIVE mg/dL
HGB URINE DIPSTICK: NEGATIVE
Ketones, ur: NEGATIVE mg/dL
Leukocytes, UA: NEGATIVE
Nitrite: NEGATIVE
PH: 6.5 (ref 5.0–8.0)
Protein, ur: NEGATIVE mg/dL

## 2015-10-16 LAB — CBC WITH DIFFERENTIAL/PLATELET
BASOS ABS: 0 10*3/uL (ref 0.0–0.1)
Basophils Relative: 0 %
Eosinophils Absolute: 0.3 10*3/uL (ref 0.0–0.7)
Eosinophils Relative: 2 %
HCT: 40.7 % (ref 36.0–46.0)
Hemoglobin: 14.1 g/dL (ref 12.0–15.0)
LYMPHS ABS: 2.8 10*3/uL (ref 0.7–4.0)
LYMPHS PCT: 24 %
MCH: 35.2 pg — AB (ref 26.0–34.0)
MCHC: 34.6 g/dL (ref 30.0–36.0)
MCV: 101.5 fL — AB (ref 78.0–100.0)
MONO ABS: 0.6 10*3/uL (ref 0.1–1.0)
Monocytes Relative: 5 %
Neutro Abs: 8.1 10*3/uL — ABNORMAL HIGH (ref 1.7–7.7)
Neutrophils Relative %: 69 %
Platelets: 268 10*3/uL (ref 150–400)
RBC: 4.01 MIL/uL (ref 3.87–5.11)
RDW: 12.2 % (ref 11.5–15.5)
WBC: 11.8 10*3/uL — AB (ref 4.0–10.5)

## 2015-10-16 LAB — WET PREP, GENITAL
Sperm: NONE SEEN
TRICH WET PREP: NONE SEEN
Yeast Wet Prep HPF POC: NONE SEEN

## 2015-10-16 LAB — POCT PREGNANCY, URINE: Preg Test, Ur: NEGATIVE

## 2015-10-16 MED ORDER — METRONIDAZOLE 0.75 % VA GEL: 1.0000 | Freq: Two times a day (BID) | VAGINAL | 0 refills | Status: DC

## 2015-10-16 MED ORDER — METRONIDAZOLE 500 MG PO TABS: 500.0000 mg | ORAL_TABLET | Freq: Two times a day (BID) | ORAL | 0 refills | Status: DC

## 2015-10-16 NOTE — MAU Provider Note (Signed)
History     CSN: [id number]  Arrival date and time: 10/16/15 2107   First Provider Initiated Contact with Patient 10/16/15 2141      Chief Complaint  Patient presents with  . Vaginal Bleeding  . Abdominal Pain   HPI  Ms. Jean Jensen is a 30 y.o. [phone] who presents to MAU today with complaint of irregular bleeding and pelvic pain. The patient states that she had issues with irregular bleeding with on Micronor after the birth of her child. She then states that they changed her to Sprintec to help control her bleeding. She didn't feel this helped expect when she was taking it BID. She states that when she changed to daily Sprintec her irregular bleeding continued. She discontinued the OCPs herself last summer and states regular periods since then until this month except for a few episodes of post coital bleeding last month. At that time she was treated for BV. She states normal LMP 10/06/15, but then resumed bleeding last night with associated cramping. She states heavy bleeding, but has used 2 tampons today only. She rates lower abdominal pain at 3/10 now. She took Tylenol for pain, last dose last night. She denies dysuria, vaginal discharge, fever or new partners. She has had nausea (chronic) without vomiting, diarrhea or constipation.   OB History    Gravida Para Term Preterm AB Living   4 3 2   1 3    SAB TAB Ectopic Multiple Live Births   0   1          Past Medical History:  Diagnosis Date  . Abnormal Pap smear   . Depression     Past Surgical History:  Procedure Laterality Date  . CESAREAN SECTION    . EYE SURGERY    . WISDOM TOOTH EXTRACTION      Family History  Problem Relation Age of Onset  . Cancer Paternal Uncle   . Cancer Maternal Grandmother   . Cancer Maternal Grandfather   . Diabetes Paternal Grandmother   . Heart disease Paternal Grandmother   . Hypertension Paternal Grandmother   . COPD Paternal Grandmother     Social History  Substance Use Topics  .  Smoking status: Current Every Day Smoker    Packs/day: 1.00    Years: 14.00    Types: Cigarettes  . Smokeless tobacco: Never Used  . Alcohol use Yes     Comment: 4 yrs ago    Allergies:  Allergies  Allergen Reactions  . Penicillins Other (See Comments)    Happened in childhood, reaction unknown    Prescriptions Prior to Admission  Medication Sig Dispense Refill Last Dose  . Ascorbic Acid (VITAMIN C PO) Take 1 tablet by mouth every morning.   10/15/2015 at Unknown time  . Biotin w/ Vitamins C & E (HAIR/SKIN/NAILS PO) Take 1 tablet by mouth every morning.   10/16/2015 at Unknown time  . Prenatal Vit-Fe Fumarate-FA (PRENATAL MULTIVITAMIN) TABS tablet Take 1 tablet by mouth daily at 12 noon.   10/16/2015 at Unknown time  . [DISCONTINUED] OVER THE COUNTER MEDICATION Take 1 tablet by mouth daily. Takes a B vitamin   10/16/2015 at Unknown time  . norgestimate-ethinyl estradiol (ORTHO-CYCLEN,SPRINTEC,PREVIFEM) 0.25-35 MG-MCG tablet Take 1 tablet by mouth daily. Take 2 tablets daily for 7 days then 1 pill daily. (Patient not taking: Reported on 10/16/2015) 1 Package 6 Not Taking at Unknown time    Review of Systems  Constitutional: Negative for fever and malaise/fatigue.  Gastrointestinal: Positive  for abdominal pain and nausea. Negative for constipation, diarrhea and vomiting.  Genitourinary: Negative for dysuria, frequency and urgency.       + vaginal bleeding Neg - vaginal discharge   Physical Exam   Blood pressure 111/67, pulse 83, temperature 98 F (36.7 C), temperature source Oral, resp. rate 16, height  (1.575 m), weight 113 lb (51.3 kg), last menstrual period 10/15/2015, SpO2 99 %, unknown if currently breastfeeding.  Physical Exam  Nursing note and vitals reviewed. Constitutional: She is oriented to person, place, and time. She appears well-developed and well-nourished. No distress.  HENT:  Head: Normocephalic and atraumatic.  Cardiovascular: Normal rate.   Respiratory: Effort  normal.  GI: Soft. She exhibits no distension and no mass. There is tenderness (mild tenderness to palpation of the lower abdomen bilaterally). There is no rebound and no guarding.  Genitourinary: Uterus is tender (mild). Uterus is not enlarged. Cervix exhibits no motion tenderness, no discharge and no friability. Right adnexum displays no mass and no tenderness. Left adnexum displays no mass and no tenderness. There is bleeding (scant brown blood in the vaginal vault) in the vagina. No vaginal discharge found.  Neurological: She is alert and oriented to person, place, and time.  Skin: Skin is warm and dry. No erythema.  Psychiatric: She has a normal mood and affect.   Results for orders placed or performed during the hospital encounter of 10/16/15 (from the past 24 hour(s))  Urinalysis, Routine w reflex microscopic (not at [facility])     Status: Abnormal   Collection Time: 10/16/15  9:20 PM  Result Value Ref Range   Color, Urine YELLOW YELLOW   APPearance CLEAR CLEAR   Specific Gravity, Urine <1.005 (L) 1.005 - 1.030   pH 6.5 5.0 - 8.0   Glucose, UA NEGATIVE NEGATIVE mg/dL   Hgb urine dipstick NEGATIVE NEGATIVE   Bilirubin Urine NEGATIVE NEGATIVE   Ketones, ur NEGATIVE NEGATIVE mg/dL   Protein, ur NEGATIVE NEGATIVE mg/dL   Nitrite NEGATIVE NEGATIVE   Leukocytes, UA NEGATIVE NEGATIVE  Pregnancy, urine POC     Status: None   Collection Time: 10/16/15  9:30 PM  Result Value Ref Range   Preg Test, Ur NEGATIVE NEGATIVE  CBC with Differential/Platelet     Status: Abnormal   Collection Time: 10/16/15  9:50 PM  Result Value Ref Range   WBC 11.8 (H) 4.0 - 10.5 K/uL   RBC 4.01 3.87 - 5.11 MIL/uL   Hemoglobin 14.1 12.0 - 15.0 g/dL   HCT 96.0 45.4 - 09.8 %   MCV 101.5 (H) 78.0 - 100.0 fL   MCH 35.2 (H) 26.0 - 34.0 pg   MCHC 34.6 30.0 - 36.0 g/dL   RDW 11.9 14.7 - 82.9 %   Platelets 268 150 - 400 K/uL   Neutrophils Relative % 69 %   Neutro Abs 8.1 (H) 1.7 - 7.7 K/uL   Lymphocytes Relative 24 %    Lymphs Abs 2.8 0.7 - 4.0 K/uL   Monocytes Relative 5 %   Monocytes Absolute 0.6 0.1 - 1.0 K/uL   Eosinophils Relative 2 %   Eosinophils Absolute 0.3 0.0 - 0.7 K/uL   Basophils Relative 0 %   Basophils Absolute 0.0 0.0 - 0.1 K/uL  Wet prep, genital     Status: Abnormal   Collection Time: 10/16/15 10:05 PM  Result Value Ref Range   Yeast Wet Prep HPF POC NONE SEEN NONE SEEN   Trich, Wet Prep NONE SEEN NONE SEEN   Clue  Cells Wet Prep HPF POC PRESENT (A) NONE SEEN   WBC, Wet Prep HPF POC MODERATE (A) NONE SEEN   Sperm NONE SEEN     MAU Course  Procedures None  MDM UPT - negative UA, CBC, wet prep and GC/Chlamydia today  Since this is the first episode of irregular bleeding in > 1 year since discontinuing OCPs and bleeding is minimal on exam, we will not sure medication to manage bleeding today. Patient is advised that if irregular bleeding continues she will need to contact [facility] for an appointment for further work-up and management.  Assessment and Plan  A: Abnormal uterine bleeding Bacterial vaginosis  P: Discharge home Rx for Flagyl given to patient  Bleeding precautions discussed Patient advised to follow-up with [facility] if symptoms persist or worsen for further evaluation and management Patient may return to MAU as needed or if her condition were to change or worsen   Marny Lowenstein, PA-C  10/16/2015, 10:36 PM

## 2015-10-16 NOTE — Discharge Instructions (Signed)
Abnormal Uterine Bleeding °Abnormal uterine bleeding means bleeding from the vagina that is not your normal menstrual period. This can be: °· Bleeding or spotting between periods. °· Bleeding after sex (sexual intercourse). °· Bleeding that is heavier or more than normal. °· Periods that last longer than usual. °· Bleeding after menopause. °There are many problems that may cause this. Treatment will depend on the cause of the bleeding. Any kind of bleeding that is not normal should be reviewed by your doctor.  °HOME CARE °Watch your condition for any changes. These actions may lessen any discomfort you are having: °· Do not use tampons or douches as told by your doctor. °· Change your pads often. °You should get regular pelvic exams and Pap tests. Keep all appointments for tests as told by your doctor. °GET HELP IF: °· You are bleeding for more than 1 week. °· You feel dizzy at times. °GET HELP RIGHT AWAY IF:  °· You pass out. °· You have to change pads every 15 to 30 minutes. °· You have belly pain. °· You have a fever. °· You become sweaty or weak. °· You are passing large blood clots from the vagina. °· You feel sick to your stomach (nauseous) and throw up (vomit). °MAKE SURE YOU: °· Understand these instructions. °· Will watch your condition. °· Will get help right away if you are not doing well or get worse. °  °This information is not intended to replace advice given to you by your health care provider. Make sure you discuss any questions you have with your health care provider. °  °Document Released: 12/26/2008 Document Revised: 03/05/2013 Document Reviewed: 09/27/2012 °Elsevier Interactive Patient Education ©2016 Elsevier Inc. ° °Bacterial Vaginosis °Bacterial vaginosis is an infection of the vagina. It happens when too many germs (bacteria) grow in the vagina. Having this infection puts you at risk for getting other infections from sex. Treating this infection can help lower your risk for other infections,  such as:  °· Chlamydia. °· Gonorrhea. °· HIV. °· Herpes. °HOME CARE °· Take your medicine as told by your doctor. °· Finish your medicine even if you start to feel better. °· Tell your sex partner that you have an infection. They should see their doctor for treatment. °· During treatment: °¨ Avoid sex or use condoms correctly. °¨ Do not douche. °¨ Do not drink alcohol unless your doctor tells you it is ok. °¨ Do not breastfeed unless your doctor tells you it is ok. °GET HELP IF: °· You are not getting better after 3 days of treatment. °· You have more grey fluid (discharge) coming from your vagina than before. °· You have more pain than before. °· You have a fever. °MAKE SURE YOU:  °· Understand these instructions. °· Will watch your condition. °· Will get help right away if you are not doing well or get worse. °  °This information is not intended to replace advice given to you by your health care provider. Make sure you discuss any questions you have with your health care provider. °  °Document Released: 12/08/2007 Document Revised: 03/21/2014 Document Reviewed: 10/10/2012 °Elsevier Interactive Patient Education ©2016 Elsevier Inc. ° °

## 2015-10-16 NOTE — MAU Note (Signed)
Pt reports she had her period on 07/25 and started bleeding again last pm and having lower abd pain.

## 2015-10-19 LAB — GC/CHLAMYDIA PROBE AMP (~~LOC~~) NOT AT ARMC
CHLAMYDIA, DNA PROBE: POSITIVE — AB
Neisseria Gonorrhea: NEGATIVE

## 2015-10-22 ENCOUNTER — Telehealth (HOSPITAL_COMMUNITY): Payer: Self-pay | Admitting: *Deleted

## 2015-10-22 DIAGNOSIS — A749 Chlamydial infection, unspecified: Secondary | ICD-10-CM

## 2015-10-22 MED ORDER — AZITHROMYCIN 500 MG PO TABS: ORAL_TABLET | ORAL | 0 refills | Status: DC

## 2015-10-22 NOTE — Telephone Encounter (Signed)
Telephone call to patient regarding positive chlamydia culture, patient notified.  Rx routed to pharmacy per protocol.  Instructed patient to notify her partner for treatment and to abstain from sex for seven days post treatment.  Report faxed to health department.  

## 2015-10-25 ENCOUNTER — Inpatient Hospital Stay (HOSPITAL_COMMUNITY)
Admission: AD | Admit: 2015-10-25 | Discharge: 2015-10-25 | Disposition: A | Discharge status: Home / Self Care | Payer: Medicaid Other | Source: Ambulatory Visit | Attending: Obstetrics & Gynecology | Admitting: Obstetrics & Gynecology

## 2015-10-25 ENCOUNTER — Encounter (HOSPITAL_COMMUNITY): Payer: Self-pay

## 2015-10-25 DIAGNOSIS — N939 Abnormal uterine and vaginal bleeding, unspecified: Secondary | ICD-10-CM | POA: Diagnosis present

## 2015-10-25 DIAGNOSIS — N390 Urinary tract infection, site not specified: Secondary | ICD-10-CM

## 2015-10-25 DIAGNOSIS — N730 Acute parametritis and pelvic cellulitis: Secondary | ICD-10-CM | POA: Diagnosis not present

## 2015-10-25 DIAGNOSIS — F17210 Nicotine dependence, cigarettes, uncomplicated: Secondary | ICD-10-CM | POA: Insufficient documentation

## 2015-10-25 DIAGNOSIS — Z880 Allergy status to penicillin: Secondary | ICD-10-CM | POA: Insufficient documentation

## 2015-10-25 DIAGNOSIS — N739 Female pelvic inflammatory disease, unspecified: Secondary | ICD-10-CM | POA: Diagnosis not present

## 2015-10-25 LAB — URINALYSIS, ROUTINE W REFLEX MICROSCOPIC
Bilirubin Urine: NEGATIVE
GLUCOSE, UA: NEGATIVE mg/dL
Ketones, ur: NEGATIVE mg/dL
Nitrite: POSITIVE — AB
Protein, ur: 100 mg/dL — AB
SPECIFIC GRAVITY, URINE: 1.015 (ref 1.005–1.030)
pH: 8 (ref 5.0–8.0)

## 2015-10-25 LAB — CBC
HEMATOCRIT: 37.1 % (ref 36.0–46.0)
Hemoglobin: 13 g/dL (ref 12.0–15.0)
MCH: 35.4 pg — ABNORMAL HIGH (ref 26.0–34.0)
MCHC: 35 g/dL (ref 30.0–36.0)
MCV: 101.1 fL — ABNORMAL HIGH (ref 78.0–100.0)
PLATELETS: 265 10*3/uL (ref 150–400)
RBC: 3.67 MIL/uL — ABNORMAL LOW (ref 3.87–5.11)
RDW: 11.9 % (ref 11.5–15.5)
WBC: 7 10*3/uL (ref 4.0–10.5)

## 2015-10-25 LAB — URINE MICROSCOPIC-ADD ON

## 2015-10-25 LAB — POCT PREGNANCY, URINE: PREG TEST UR: NEGATIVE

## 2015-10-25 MED ORDER — CEFTRIAXONE SODIUM 250 MG IJ SOLR
250.0000 mg | INTRAMUSCULAR | Status: AC
  Administered 2015-10-25: 250 mg via INTRAMUSCULAR
  Filled 2015-10-25: qty 250

## 2015-10-25 MED ORDER — CLINDAMYCIN PHOSPHATE 2 % VA CREA: 1.0000 | TOPICAL_CREAM | Freq: Every day | VAGINAL | 0 refills | Status: DC

## 2015-10-25 MED ORDER — DOXYCYCLINE HYCLATE 100 MG PO CAPS: 100.0000 mg | ORAL_CAPSULE | Freq: Two times a day (BID) | ORAL | 0 refills | Status: DC

## 2015-10-25 MED ORDER — IBUPROFEN 600 MG PO TABS: 600.0000 mg | ORAL_TABLET | Freq: Four times a day (QID) | ORAL | 0 refills | Status: DC | PRN

## 2015-10-25 MED ORDER — KETOROLAC TROMETHAMINE 60 MG/2ML IM SOLN
60.0000 mg | INTRAMUSCULAR | Status: AC
  Administered 2015-10-25: 60 mg via INTRAMUSCULAR
  Filled 2015-10-25: qty 2

## 2015-10-25 NOTE — MAU Note (Signed)
Pt presents complaining of worse cramping and bleeding with clots since last Friday when she was seen in MAU. Also has headache and back pain. Has not tried any medication for the pain.

## 2015-10-25 NOTE — MAU Provider Note (Signed)
History   [id number]   Chief Complaint  Patient presents with  . Vaginal Bleeding  . Abdominal Pain  . Headache    HPI Jean Jensen is a 30 y.o. female  [phone] here with report of continued vaginal bleeding and increasing pelvic cramping since being seen in MAU on  10/16/15.  Pt was previously seen for irregular bleeding and pelvic pain.  Diagnosed with bacterial vaginosis and chlamydia with treatment.  Pt states has taken zithromax and partner is going for screening on Monday.  Reports being unable to take Metronidazole due GI upset.  Recent depression due to father's death and concerned about adding GI issues on top of that (decreased PO intake, nausea).  Reports passing clots today, grape size and needing to change tampon within two hours.  Denies dizziness or lightheadedness, +headache.    Patient's last menstrual period was 10/15/2015.  OB History  Gravida Para Term Preterm AB Living  4 3 2   1 3   SAB TAB Ectopic Multiple Live Births  0   1        # Outcome Date GA Lbr Len/2nd Weight Sex Delivery Anes PTL Lv  4 Ectopic           3 Para           2 Term           1 Term               Past Medical History:  Diagnosis Date  . Abnormal Pap smear   . Depression     Family History  Problem Relation Age of Onset  . Cancer Paternal Uncle   . Cancer Maternal Grandmother   . Cancer Maternal Grandfather   . Diabetes Paternal Grandmother   . Heart disease Paternal Grandmother   . Hypertension Paternal Grandmother   . COPD Paternal Grandmother     Social History   Social History  . Marital status: Legally Separated    Spouse name: N/A  . Number of children: N/A  . Years of education: N/A   Social History Main Topics  . Smoking status: Current Every Day Smoker    Packs/day: 1.00    Years: 14.00    Types: Cigarettes  . Smokeless tobacco: Never Used  . Alcohol use Yes     Comment: 4 yrs ago  . Drug use:     Types: Marijuana, Cocaine, MDMA (Ecstacy)     Comment:  7 yrs ago  . Sexual activity: Yes    Birth control/ protection: Condom   Other Topics Concern  . None   Social History Narrative  . None    Allergies  Allergen Reactions  . Penicillins Other (See Comments)    Happened in childhood, reaction unknown    No current facility-administered medications on file prior to encounter.    Current Outpatient Prescriptions on File Prior to Encounter  Medication Sig Dispense Refill  . Ascorbic Acid (VITAMIN C PO) Take 1 tablet by mouth every morning.    Marland Kitchen. azithromycin (ZITHROMAX) 500 MG tablet Take two tablets by mouth once. 2 tablet 0  . Biotin w/ Vitamins C & E (HAIR/SKIN/NAILS PO) Take 1 tablet by mouth every morning.    . metroNIDAZOLE (METROGEL VAGINAL) 0.75 % vaginal gel Place 1 Applicatorful vaginally 2 (two) times daily. 70 g 0  . Prenatal Vit-Fe Fumarate-FA (PRENATAL MULTIVITAMIN) TABS tablet Take 1 tablet by mouth daily at 12 noon.       Review of Systems  Constitutional: Negative for chills and fever.  Gastrointestinal: Positive for nausea. Negative for constipation, diarrhea and vomiting.  Genitourinary: Positive for menstrual problem, pelvic pain and vaginal bleeding. Negative for dysuria.  Musculoskeletal: Positive for back pain.  Neurological: Positive for headaches. Negative for dizziness and light-headedness.  All other systems reviewed and are negative.    Physical Exam   Vitals:   10/25/15 2039  BP: 119/71  Pulse: 94  Resp: 18  Temp: 98.3 F (36.8 C)  TempSrc: Oral    Physical Exam  Constitutional: She is oriented to person, place, and time. She appears well-developed and well-nourished.  HENT:  Head: Normocephalic.  Neck: Normal range of motion. Neck supple.  Cardiovascular: Normal rate, regular rhythm and normal heart sounds.   Respiratory: Effort normal and breath sounds normal.  GI: Soft. She exhibits no mass. There is tenderness. There is no guarding.  Genitourinary: Uterus is enlarged and tender.  Cervix exhibits motion tenderness. Right adnexum displays no mass and no tenderness. Left adnexum displays no mass and no tenderness. There is bleeding (negative clots, moderate bleeding) in the vagina.  Neurological: She is alert and oriented to person, place, and time. She has normal reflexes.  Skin: Skin is warm and dry.    MAU Course  Procedures  MDM Toradol 60 mg IM  Rocephin 250 mg IM  Results for orders placed or performed during the hospital encounter of 10/25/15 (from the past 24 hour(s))  Urinalysis, Routine w reflex microscopic (not at [facility]     Status: Abnormal   Collection Time: 10/25/15  8:37 PM  Result Value Ref Range   Color, Urine RED (A) YELLOW   APPearance CLOUDY (A) CLEAR   Specific Gravity, Urine 1.015 1.005 - 1.030   pH 8.0 5.0 - 8.0   Glucose, UA NEGATIVE NEGATIVE mg/dL   Hgb urine dipstick LARGE (A) NEGATIVE   Bilirubin Urine NEGATIVE NEGATIVE   Ketones, ur NEGATIVE NEGATIVE mg/dL   Protein, ur 244 (A) NEGATIVE mg/dL   Nitrite POSITIVE (A) NEGATIVE   Leukocytes, UA TRACE (A) NEGATIVE  Urine microscopic-add on     Status: Abnormal   Collection Time: 10/25/15  8:37 PM  Result Value Ref Range   Squamous Epithelial / LPF 0-5 (A) NONE SEEN   WBC, UA 0-5 0 - 5 WBC/hpf   RBC / HPF TOO NUMEROUS TO COUNT 0 - 5 RBC/hpf   Bacteria, UA RARE (A) NONE SEEN  Pregnancy, urine POC     Status: None   Collection Time: 10/25/15  9:02 PM  Result Value Ref Range   Preg Test, Ur NEGATIVE NEGATIVE  CBC     Status: Abnormal   Collection Time: 10/25/15  9:12 PM  Result Value Ref Range   WBC 7.0 4.0 - 10.5 K/uL   RBC 3.67 (L) 3.87 - 5.11 MIL/uL   Hemoglobin 13.0 12.0 - 15.0 g/dL   HCT 01.0 27.2 - 53.6 %   MCV 101.1 (H) 78.0 - 100.0 fL   MCH 35.4 (H) 26.0 - 34.0 pg   MCHC 35.0 30.0 - 36.0 g/dL   RDW 64.4 03.4 - 74.2 %   Platelets 265 150 - 400 K/uL     Assessment and Plan  PID UTI  Plan: Discharge home Urine culture sent RX Doxycycline 100 mg BID x 14 days RX  Clindamycin cream for BV (unable to take Flagyl) Follow-up with gyn prn (pt plans to pick and schedule an appointment) Marlis Edelson, CNM     Kae Heller  Elenora Fender, CNM 10/25/2015 9:33 PM

## 2015-10-25 NOTE — Discharge Instructions (Signed)
[facility]    [facility]  [phone]     Phone: [phone]          [facility]                      [facility]  @[place]     Phone: [phone]  Phone: [phone]         [facility]     Phone: [phone]  Phone: [phone]           [facility] [place]                hone: [phone]  Phone: [phone]         [facility]                Phone: [phone]  [facility]   [address]Phone:[phone]    [facility] [place]          Phone: [phone] [facility]   Phone: [phone]

## 2015-10-27 LAB — URINE CULTURE

## 2015-10-28 ENCOUNTER — Telehealth: Payer: Self-pay | Admitting: *Deleted

## 2015-10-28 NOTE — Telephone Encounter (Signed)
Clinton SawyerKailey called from pre-cert requesting preauth for [place] for tomorrow-. I obtained pre-auth.

## 2015-10-29 ENCOUNTER — Ambulatory Visit (HOSPITAL_COMMUNITY): Admission: RE | Admit: 2015-10-29 | Payer: Medicaid Other | Source: Ambulatory Visit

## 2015-10-31 ENCOUNTER — Inpatient Hospital Stay (HOSPITAL_COMMUNITY)
Admission: AD | Admit: 2015-10-31 | Discharge: 2015-11-01 | Disposition: A | Discharge status: Home / Self Care | Payer: Medicaid Other | Source: Ambulatory Visit | Attending: Obstetrics and Gynecology | Admitting: Obstetrics and Gynecology

## 2015-10-31 DIAGNOSIS — N921 Excessive and frequent menstruation with irregular cycle: Secondary | ICD-10-CM

## 2015-10-31 DIAGNOSIS — R109 Unspecified abdominal pain: Secondary | ICD-10-CM | POA: Insufficient documentation

## 2015-10-31 DIAGNOSIS — N939 Abnormal uterine and vaginal bleeding, unspecified: Secondary | ICD-10-CM | POA: Insufficient documentation

## 2015-10-31 DIAGNOSIS — R42 Dizziness and giddiness: Secondary | ICD-10-CM | POA: Insufficient documentation

## 2015-10-31 DIAGNOSIS — N920 Excessive and frequent menstruation with regular cycle: Secondary | ICD-10-CM | POA: Insufficient documentation

## 2015-11-01 ENCOUNTER — Encounter (HOSPITAL_COMMUNITY): Payer: Self-pay

## 2015-11-01 DIAGNOSIS — N939 Abnormal uterine and vaginal bleeding, unspecified: Secondary | ICD-10-CM | POA: Diagnosis present

## 2015-11-01 DIAGNOSIS — N920 Excessive and frequent menstruation with regular cycle: Secondary | ICD-10-CM | POA: Diagnosis not present

## 2015-11-01 DIAGNOSIS — R109 Unspecified abdominal pain: Secondary | ICD-10-CM | POA: Diagnosis not present

## 2015-11-01 DIAGNOSIS — R42 Dizziness and giddiness: Secondary | ICD-10-CM | POA: Diagnosis not present

## 2015-11-01 LAB — URINALYSIS, ROUTINE W REFLEX MICROSCOPIC
BILIRUBIN URINE: NEGATIVE
GLUCOSE, UA: NEGATIVE mg/dL
Ketones, ur: 15 mg/dL — AB
Leukocytes, UA: NEGATIVE
Nitrite: NEGATIVE
PROTEIN: 30 mg/dL — AB
Specific Gravity, Urine: 1.025 (ref 1.005–1.030)
pH: 6 (ref 5.0–8.0)

## 2015-11-01 LAB — CBC
HCT: 38.6 % (ref 36.0–46.0)
HEMOGLOBIN: 13.5 g/dL (ref 12.0–15.0)
MCH: 35.9 pg — ABNORMAL HIGH (ref 26.0–34.0)
MCHC: 35 g/dL (ref 30.0–36.0)
MCV: 102.7 fL — ABNORMAL HIGH (ref 78.0–100.0)
Platelets: 327 10*3/uL (ref 150–400)
RBC: 3.76 MIL/uL — ABNORMAL LOW (ref 3.87–5.11)
RDW: 11.5 % (ref 11.5–15.5)
WBC: 9.9 10*3/uL (ref 4.0–10.5)

## 2015-11-01 LAB — URINE MICROSCOPIC-ADD ON

## 2015-11-01 LAB — POCT PREGNANCY, URINE: Preg Test, Ur: NEGATIVE

## 2015-11-01 MED ORDER — MEGESTROL ACETATE 40 MG PO TABS
120.0000 mg | ORAL_TABLET | Freq: Once | ORAL | Status: AC
  Administered 2015-11-01: 120 mg via ORAL
  Filled 2015-11-01: qty 3

## 2015-11-01 MED ORDER — MEGESTROL ACETATE 40 MG PO TABS: ORAL_TABLET | ORAL | 1 refills | Status: DC

## 2015-11-01 NOTE — Discharge Instructions (Signed)

## 2015-11-01 NOTE — MAU Note (Signed)
Pt c/o heavy vaginal bleeding, passing grape-sized clots and LLQ pain-has progressively gotten worse. Is not on birth control. Has had irregular vaginal bleeding x1 year.

## 2015-11-03 ENCOUNTER — Ambulatory Visit (HOSPITAL_COMMUNITY)
Admission: RE | Admit: 2015-11-03 | Discharge: 2015-11-03 | Disposition: A | Discharge status: Home / Self Care | Payer: Medicaid Other | Source: Ambulatory Visit | Attending: Family | Admitting: Family

## 2015-11-03 DIAGNOSIS — N939 Abnormal uterine and vaginal bleeding, unspecified: Secondary | ICD-10-CM | POA: Insufficient documentation

## 2015-11-09 ENCOUNTER — Telehealth: Payer: Self-pay | Admitting: *Deleted

## 2015-11-09 ENCOUNTER — Other Ambulatory Visit: Payer: Self-pay | Admitting: Family Medicine

## 2015-11-09 MED ORDER — MEGESTROL ACETATE 40 MG PO TABS: 40.0000 mg | ORAL_TABLET | Freq: Two times a day (BID) | ORAL | 1 refills | Status: DC

## 2015-11-09 NOTE — Progress Notes (Signed)
Patient called after hours.  Had been seen in MAU and had US ordered - US results normal.  Patient's bleeding has increased since tapering down on the megace.  Will increase to 40mg  BID.

## 2015-11-09 NOTE — Telephone Encounter (Signed)
Pt left message requesting results from US last Tuesday. She also stated that she is still having problems with irregular and heavy bleeding. Office appt is scheduled on 9/12.

## 2015-11-18 NOTE — Telephone Encounter (Signed)
I called Jean Jensen and she states she had gotten a call to increase the megace from once a day to bid and that has made the bleeding decrease to mostly spotting now.  I also informed her the ultrasound showed nothing irregular that is causing her bleeding. I reviewed her appt date/time and that provider would review us results and discuss plan of care options.  She voices understanding.

## 2015-11-23 ENCOUNTER — Encounter: Payer: Medicaid Other | Admitting: Advanced Practice Midwife

## 2015-11-24 ENCOUNTER — Ambulatory Visit (INDEPENDENT_AMBULATORY_CARE_PROVIDER_SITE_OTHER): Payer: Medicaid Other | Admitting: Advanced Practice Midwife

## 2015-11-24 ENCOUNTER — Encounter: Payer: Self-pay | Admitting: Advanced Practice Midwife

## 2015-11-24 VITALS — BP 103/63 | HR 69 | Wt 110.8 lb

## 2015-11-24 DIAGNOSIS — Z30430 Encounter for insertion of intrauterine contraceptive device: Secondary | ICD-10-CM | POA: Diagnosis not present

## 2015-11-24 DIAGNOSIS — Z3202 Encounter for pregnancy test, result negative: Secondary | ICD-10-CM | POA: Diagnosis not present

## 2015-11-24 DIAGNOSIS — Z975 Presence of (intrauterine) contraceptive device: Secondary | ICD-10-CM | POA: Insufficient documentation

## 2015-11-24 DIAGNOSIS — N939 Abnormal uterine and vaginal bleeding, unspecified: Secondary | ICD-10-CM | POA: Diagnosis present

## 2015-11-24 LAB — POCT PREGNANCY, URINE: Preg Test, Ur: NEGATIVE

## 2015-11-24 MED ORDER — LEVONORGESTREL 18.6 MCG/DAY IU IUD
INTRAUTERINE_SYSTEM | Freq: Once | INTRAUTERINE | Status: AC
  Administered 2015-11-24: 1 via INTRAUTERINE

## 2015-11-24 NOTE — MAU Provider Note (Signed)
Chief Complaint: Vaginal Bleeding; Dizziness; and Abdominal Pain   First Provider Initiated Contact with Patient 11/01/15 0056      SUBJECTIVE HPI: Jean Jensen is a 30 y.o. [id number] who presents to maternity admissions reporting continued daily vaginal bleeding despite use of OCPs.  She was seen in MAU on 8/4 and 8/13 for the same concern.  She has some family history of cancer and is concerned that something is wrong.  She has tried ibuprofen and OCPs for bleeding but it does not go away. She reports hx of irregular bleeding on other forms of birth control, oral contraceptives and Depo Provera as well.  The bleeding is associated with mild cramping that has not required treatment.  She denies  urinary symptoms, h/a, dizziness, n/v, or fever/chills.     HPI  Past Medical History:  Diagnosis Date  . Abnormal Pap smear   . Depression    Past Surgical History:  Procedure Laterality Date  . CESAREAN SECTION    . EYE SURGERY    . WISDOM TOOTH EXTRACTION     Social History   Social History  . Marital status: Legally Separated    Spouse name: N/A  . Number of children: N/A  . Years of education: N/A   Occupational History  . Not on file.   Social History Main Topics  . Smoking status: Current Every Day Smoker    Packs/day: 1.00    Years: 14.00    Types: Cigarettes  . Smokeless tobacco: Never Used  . Alcohol use Yes     Comment: 4 yrs ago  . Drug use:     Types: Marijuana, Cocaine, MDMA (Ecstacy)     Comment: 7 yrs ago  . Sexual activity: Yes    Birth control/ protection: Condom   Other Topics Concern  . Not on file   Social History Narrative  . No narrative on file   No current facility-administered medications on file prior to encounter.    Current Outpatient Prescriptions on File Prior to Encounter  Medication Sig Dispense Refill  . ibuprofen (ADVIL,MOTRIN) 600 MG tablet Take 1 tablet (600 mg total) by mouth every 6 (six) hours as needed. 30 tablet 0  . Prenatal  Vit-Fe Fumarate-FA (PRENATAL MULTIVITAMIN) TABS tablet Take 1 tablet by mouth daily at 12 noon.    . Ascorbic Acid (VITAMIN C PO) Take 1 tablet by mouth every morning.    . Biotin w/ Vitamins C & E (HAIR/SKIN/NAILS PO) Take 1 tablet by mouth every morning.     Allergies  Allergen Reactions  . Penicillins Other (See Comments)    Happened in childhood, reaction unknown    ROS:  Review of Systems  Constitutional: Negative for chills, fatigue and fever.  HENT: Negative for sinus pressure.   Eyes: Negative for photophobia.  Respiratory: Negative for shortness of breath.   Cardiovascular: Negative for chest pain.  Gastrointestinal: Positive for nausea. Negative for abdominal pain and vomiting.  Genitourinary: Negative for difficulty urinating, dysuria, flank pain, frequency, pelvic pain, vaginal bleeding, vaginal discharge and vaginal pain.  Musculoskeletal: Negative for neck pain.  Neurological: Positive for dizziness and headaches. Negative for weakness.  Psychiatric/Behavioral: Negative.      I have reviewed patient's Past Medical Hx, Surgical Hx, Family Hx, Social Hx, medications and allergies.   Physical Exam  No data found.  Constitutional: Well-developed, well-nourished female in no acute distress.  Cardiovascular: normal rate Respiratory: normal effort GI: Abd soft, non-tender. Pos BS x 4 MS: Extremities nontender,  no edema, normal ROM Neurologic: Alert and oriented x 4.  GU: Neg CVAT.  PELVIC EXAM: Cervix pink, visually closed, without lesion, scant light red bleeding, vaginal walls and external genitalia normal Bimanual exam: Cervix 0/long/high, firm, anterior, neg CMT, uterus nontender, nonenlarged, adnexa without tenderness, enlargement, or mass   LAB RESULTS No results found for this or any previous visit (from the past 24 hour(s)).     Urinalysis, Routine w reflex microscopic (not at [facility])     Status: Abnormal   Collection Time: 11/01/15 12:01 AM  Result Value  Ref Range   Color, Urine YELLOW YELLOW   APPearance CLEAR CLEAR   Specific Gravity, Urine 1.025 1.005 - 1.030   pH 6.0 5.0 - 8.0   Glucose, UA NEGATIVE NEGATIVE mg/dL   Hgb urine dipstick LARGE (A) NEGATIVE   Bilirubin Urine NEGATIVE NEGATIVE   Ketones, ur 15 (A) NEGATIVE mg/dL   Protein, ur 30 (A) NEGATIVE mg/dL   Nitrite NEGATIVE NEGATIVE   Leukocytes, UA NEGATIVE NEGATIVE  Urine microscopic-add on     Status: Abnormal   Collection Time: 11/01/15 12:01 AM  Result Value Ref Range   Squamous Epithelial / LPF 0-5 (A) NONE SEEN   WBC, UA 0-5 0 - 5 WBC/hpf   RBC / HPF 0-5 0 - 5 RBC/hpf   Bacteria, UA FEW (A) NONE SEEN   Casts GRANULAR CAST (A) NEGATIVE  Pregnancy, urine POC     Status: None   Collection Time: 11/01/15 12:14 AM  Result Value Ref Range   Preg Test, Ur NEGATIVE NEGATIVE    Comment:        THE SENSITIVITY OF THIS METHODOLOGY IS >24 mIU/mL   CBC     Status: Abnormal   Collection Time: 11/01/15 12:49 AM  Result Value Ref Range   WBC 9.9 4.0 - 10.5 K/uL   RBC 3.76 (L) 3.87 - 5.11 MIL/uL   Hemoglobin 13.5 12.0 - 15.0 g/dL   HCT 16.1 09.6 - 04.5 %   MCV 102.7 (H) 78.0 - 100.0 fL   MCH 35.9 (H) 26.0 - 34.0 pg   MCHC 35.0 30.0 - 36.0 g/dL   RDW 40.9 81.1 - 91.4 %   Platelets 327 150 - 400 K/uL    IMAGING   MAU Management/MDM: Ordered labs and reviewed results.  Will treat AUB with Megace taper.  Outpatient [place] ordered, f/u in office in a few weeks.  Discussed possible treatment options including IUD and ablation with pt.  Pt states understanding.  Pt stable at time of discharge.  ASSESSMENT 1. Abnormal uterine bleeding (AUB)   2. Menorrhagia with irregular cycle     PLAN Discharge home with bleeding precautions    Medication List    TAKE these medications   ibuprofen 600 MG tablet Commonly known as:  ADVIL,MOTRIN Take 1 tablet (600 mg total) by mouth every 6 (six) hours as needed.   prenatal multivitamin Tabs tablet Take 1 tablet by mouth daily at 12  noon.     ASK your doctor about these medications   HAIR/SKIN/NAILS PO Take 1 tablet by mouth every morning.   VITAMIN C PO Take 1 tablet by mouth every morning.      Follow-up Information    [facility] .   Specialty:  Obstetrics and Gynecology Why:  The office will call you with an appointment with Sharen Counter, CNM.  Return to MAU as needed for emergencies. Contact information: [address] [place] [address]  [phone]          Sharen CounterLisa Leftwich-Kirby Certified Nurse-Midwife 11/24/2015  10:42 AM

## 2015-11-24 NOTE — Addendum Note (Signed)
Addended by: Faythe CasaBELLAMY, Jakeira Seeman M on: 11/24/2015 11:50 AM   Modules accepted: Orders

## 2015-11-24 NOTE — Patient Instructions (Signed)
Levonorgestrel intrauterine device (IUD) What is this medicine? LEVONORGESTREL IUD (LEE voe nor jes trel) is a contraceptive (birth control) device. The device is placed inside the uterus by a healthcare professional. It is used to prevent pregnancy and can also be used to treat heavy bleeding that occurs during your period. Depending on the device, it can be used for 3 to 5 years. This medicine may be used for other purposes; ask your health care provider or pharmacist if you have questions. What should I tell my health care provider before I take this medicine? They need to know if you have any of these conditions: -abnormal Pap smear -cancer of the breast, uterus, or cervix -diabetes -endometritis -genital or pelvic infection now or in the past -have more than one sexual partner or your partner has more than one partner -heart disease -history of an ectopic or tubal pregnancy -immune system problems -IUD in place -liver disease or tumor -problems with blood clots or take blood-thinners -use intravenous drugs -uterus of unusual shape -vaginal bleeding that has not been explained -an unusual or allergic reaction to levonorgestrel, other hormones, silicone, or polyethylene, medicines, foods, dyes, or preservatives -pregnant or trying to get pregnant -breast-feeding How should I use this medicine? This device is placed inside the uterus by a health care professional. Talk to your pediatrician regarding the use of this medicine in children. Special care may be needed. Overdosage: If you think you have taken too much of this medicine contact a poison control center or emergency room at once. NOTE: This medicine is only for you. Do not share this medicine with others. What if I miss a dose? This does not apply. What may interact with this medicine? Do not take this medicine with any of the following medications: -amprenavir -bosentan -fosamprenavir This medicine may also interact with  the following medications: -aprepitant -barbiturate medicines for inducing sleep or treating seizures -bexarotene -griseofulvin -medicines to treat seizures like carbamazepine, ethotoin, felbamate, oxcarbazepine, phenytoin, topiramate -modafinil -pioglitazone -rifabutin -rifampin -rifapentine -some medicines to treat HIV infection like atazanavir, indinavir, lopinavir, nelfinavir, tipranavir, ritonavir -St. John's wort -warfarin This list may not describe all possible interactions. Give your health care provider a list of all the medicines, herbs, non-prescription drugs, or dietary supplements you use. Also tell them if you smoke, drink alcohol, or use illegal drugs. Some items may interact with your medicine. What should I watch for while using this medicine? Visit your doctor or health care professional for regular check ups. See your doctor if you or your partner has sexual contact with others, becomes HIV positive, or gets a sexual transmitted disease. This product does not protect you against HIV infection (AIDS) or other sexually transmitted diseases. You can check the placement of the IUD yourself by reaching up to the top of your vagina with clean fingers to feel the threads. Do not pull on the threads. It is a good habit to check placement after each menstrual period. Call your doctor right away if you feel more of the IUD than just the threads or if you cannot feel the threads at all. The IUD may come out by itself. You may become pregnant if the device comes out. If you notice that the IUD has come out use a backup birth control method like condoms and call your health care provider. Using tampons will not change the position of the IUD and are okay to use during your period. What side effects may I notice from receiving this medicine?   Side effects that you should report to your doctor or health care professional as soon as possible: -allergic reactions like skin rash, itching or  hives, swelling of the face, lips, or tongue -fever, flu-like symptoms -genital sores -high blood pressure -no menstrual period for 6 weeks during use -pain, swelling, warmth in the leg -pelvic pain or tenderness -severe or sudden headache -signs of pregnancy -stomach cramping -sudden shortness of breath -trouble with balance, talking, or walking -unusual vaginal bleeding, discharge -yellowing of the eyes or skin Side effects that usually do not require medical attention (report to your doctor or health care professional if they continue or are bothersome): -acne -breast pain -change in sex drive or performance -changes in weight -cramping, dizziness, or faintness while the device is being inserted -headache -irregular menstrual bleeding within first 3 to 6 months of use -nausea This list may not describe all possible side effects. Call your doctor for medical advice about side effects. You may report side effects to FDA at 1-800-FDA-1088. Where should I keep my medicine? This does not apply. NOTE: This sheet is a summary. It may not cover all possible information. If you have questions about this medicine, talk to your doctor, pharmacist, or health care provider.    2016, Elsevier/Gold Standard. (2011-03-31 13:54:04)  

## 2015-11-24 NOTE — Progress Notes (Signed)
  S: 30 y.o. [id number] presented to the office with recent hx of AUB with some resolution on Megace but desires improved treatment of bleeding. She also desires long term contraception.  She denies vaginal itching/burning, urinary symptoms, h/a, dizziness, n/v, or fever/chills.    O: BP 103/63   Pulse 69   Wt 110 lb 12.8 oz (50.3 kg)   LMP 10/10/2015   BMI 20.27 kg/m    VS reviewed, nursing note reviewed,  Constitutional: well developed, well nourished, no distress HEENT: normocephalic CV: normal rate Pulm/chest wall: normal effort Abdomen: soft Neuro: alert and oriented x 3 Skin: warm, dry Psych: affect normal  IUD Procedure Note Patient identified, informed consent performed.  Discussed risks of irregular bleeding, cramping, infection, malpositioning or misplacement of the IUD outside the uterus which may require further procedures. Time out was performed.  Urine pregnancy test negative.  Speculum placed in the vagina.  Cervix visualized.  Cleaned with Betadine x 2.  Grasped anteriorly with a single tooth tenaculum.  Uterus sounded to 8.5 cm.  Liletta IUD placed per manufacturer's recommendations.  Strings trimmed to 3 cm. Tenaculum was removed, good hemostasis noted.  Patient tolerated procedure well.   Patient was given post-procedure instructions and the Liletta care card with expiration date.  Patient was also asked to check IUD strings periodically and follow up in 4-6 weeks for IUD check.  A: AUB IUD in place  P: String check in 4 weeks Continue Megace x 1 month for bleeding then stop. Resume if bleeding >10 days. F/U PRN  LEFTWICH-KIRBY, Lara Palinkas, CNM 10:58 AM

## 2015-11-27 ENCOUNTER — Encounter (HOSPITAL_COMMUNITY): Payer: Self-pay

## 2015-11-27 ENCOUNTER — Inpatient Hospital Stay (HOSPITAL_COMMUNITY): Payer: Medicaid Other

## 2015-11-27 ENCOUNTER — Inpatient Hospital Stay (HOSPITAL_COMMUNITY)
Admission: AD | Admit: 2015-11-27 | Discharge: 2015-11-27 | Disposition: A | Discharge status: Home / Self Care | Payer: Medicaid Other | Source: Ambulatory Visit | Attending: Obstetrics and Gynecology | Admitting: Obstetrics and Gynecology

## 2015-11-27 DIAGNOSIS — T839XXA Unspecified complication of genitourinary prosthetic device, implant and graft, initial encounter: Secondary | ICD-10-CM

## 2015-11-27 DIAGNOSIS — F17210 Nicotine dependence, cigarettes, uncomplicated: Secondary | ICD-10-CM | POA: Diagnosis not present

## 2015-11-27 DIAGNOSIS — Z880 Allergy status to penicillin: Secondary | ICD-10-CM | POA: Insufficient documentation

## 2015-11-27 DIAGNOSIS — R102 Pelvic and perineal pain: Secondary | ICD-10-CM | POA: Insufficient documentation

## 2015-11-27 DIAGNOSIS — T8389XA Other specified complication of genitourinary prosthetic devices, implants and grafts, initial encounter: Secondary | ICD-10-CM

## 2015-11-27 DIAGNOSIS — T8384XA Pain from genitourinary prosthetic devices, implants and grafts, initial encounter: Secondary | ICD-10-CM | POA: Insufficient documentation

## 2015-11-27 HISTORY — DX: Other specified postprocedural states: Z98.890

## 2015-11-27 HISTORY — DX: Other specified postprocedural states: R11.2

## 2015-11-27 MED ORDER — NAPROXEN SODIUM 550 MG PO TABS: 550.0000 mg | ORAL_TABLET | Freq: Two times a day (BID) | ORAL | 0 refills | Status: AC

## 2015-11-27 MED ORDER — OXYCODONE-ACETAMINOPHEN 5-325 MG PO TABS: 1.0000 | ORAL_TABLET | Freq: Four times a day (QID) | ORAL | 0 refills | Status: AC | PRN

## 2015-11-27 NOTE — MAU Note (Signed)
Urine in lab 

## 2015-11-27 NOTE — Discharge Instructions (Signed)

## 2015-11-27 NOTE — MAU Provider Note (Signed)
Chief Complaint: iud pain   None     SUBJECTIVE HPI: Jean Jensen is a 30 y.o. [phone] who presents to maternity admissions reporting pelvic pain since insertion of IUD on 9/12 in [facility] office.  She has tried ibuprofen and flexeril which help for a short time but the pain returns.  She is concerned that something is wrong with the IUD or its placement.  She reports light vaginal bleeding that is unchanged since IUD placement.  The pain is constant and unchanged since onset. She deniesvaginal itching/burning, urinary symptoms, h/a, dizziness, n/v, or fever/chills.     HPI  Past Medical History:  Diagnosis Date  . Abnormal Pap smear   . Depression   . PONV (postoperative nausea and vomiting)    Past Surgical History:  Procedure Laterality Date  . CESAREAN SECTION    . EYE SURGERY    . WISDOM TOOTH EXTRACTION     Social History   Social History  . Marital status: Legally Separated    Spouse name: N/A  . Number of children: N/A  . Years of education: N/A   Occupational History  . Not on file.   Social History Main Topics  . Smoking status: Current Every Day Smoker    Packs/day: 1.00    Years: 14.00    Types: Cigarettes  . Smokeless tobacco: Never Used  . Alcohol use Yes     Comment: 4 yrs ago  . Drug use:     Types: Marijuana, Cocaine, MDMA (Ecstacy)     Comment: 7 yrs ago, marijuana currently  . Sexual activity: Yes    Birth control/ protection: IUD   Other Topics Concern  . Not on file   Social History Narrative  . No narrative on file   No current facility-administered medications on file prior to encounter.    Current Outpatient Prescriptions on File Prior to Encounter  Medication Sig Dispense Refill  . ibuprofen (ADVIL,MOTRIN) 600 MG tablet Take 1 tablet (600 mg total) by mouth every 6 (six) hours as needed. (Patient taking differently: Take 600 mg by mouth every 6 (six) hours as needed for moderate pain or cramping. ) 30 tablet 0  . Lactobacillus  (PROBIOTIC ACIDOPHILUS PO) Take 1 capsule by mouth daily.     . megestrol (MEGACE) 40 MG tablet Take 1 tablet (40 mg total) by mouth 2 (two) times daily. 60 tablet 1  . Prenatal Vit-Fe Fumarate-FA (PRENATAL MULTIVITAMIN) TABS tablet Take 1 tablet by mouth daily at 12 noon.     Allergies  Allergen Reactions  . Penicillins Anaphylaxis    Has patient had a PCN reaction causing immediate rash, facial/tongue/throat swelling, SOB or lightheadedness with hypotension: Yes Has patient had a PCN reaction causing severe rash involving mucus membranes or skin necrosis: Yes Has patient had a PCN reaction that required hospitalization Yes Has patient had a PCN reaction occurring within the last 10 years: No If all of the above answers are "NO", then may proceed with Cephalosporin use.     ROS:  Review of Systems  Constitutional: Negative for chills, fatigue and fever.  Respiratory: Negative for shortness of breath.   Cardiovascular: Negative for chest pain.  Gastrointestinal: Negative for nausea and vomiting.  Genitourinary: Positive for pelvic pain. Negative for difficulty urinating, dysuria, flank pain, vaginal bleeding, vaginal discharge and vaginal pain.  Neurological: Negative for dizziness and headaches.  Psychiatric/Behavioral: Negative.      I have reviewed patient's Past Medical Hx, Surgical Hx, Family Hx, Social  Hx, medications and allergies.   Physical Exam  Patient Vitals for the past 24 hrs:  BP Temp Pulse Resp Height Weight  11/27/15 1642 122/85 - 81 18 - -  11/27/15 1416 110/70 98 F (36.7 C) 87 16 5\' 2"  (1.575 m) 110 lb 12.8 oz (50.3 kg)   Constitutional: Well-developed, well-nourished female in no acute distress.  Cardiovascular: normal rate Respiratory: normal effort GI: Abd soft, non-tender. Pos BS x 4 MS: Extremities nontender, no edema, normal ROM Neurologic: Alert and oriented x 4.  GU: Neg CVAT.  PELVIC EXAM: Deferred    LAB RESULTS No results found for this  or any previous visit (from the past 24 hour(s)).     IMAGING US Transvaginal Non-ob  Result Date: 11/27/2015 CLINICAL DATA:  Pelvic pain.  Recent IUD placement. EXAM: ULTRASOUND PELVIS TRANSVAGINAL TECHNIQUE: Transvaginal ultrasound examination of the pelvis was performed including evaluation of the uterus, ovaries, adnexal regions, and pelvic cul-de-sac. COMPARISON:  11/03/2015 FINDINGS: Uterus Measurements: 9.0 x 3.7 x 4.8 cm. No fibroids or other mass visualized. Endometrium Thickness: Not well visualized. IUD is in place in expected position. Right ovary Measurements: 3.3 x 2.3 x 1.9 cm. Normal appearance/no adnexal mass. Left ovary Measurements: 2.6 x 2.4 x 1.9 cm. Normal appearance/no adnexal mass. Other findings:  No abnormal free fluid IMPRESSION: IUD in expected position within the endometrial cavity. Electronically Signed   By: Charlett Nose M.D.   On: 11/27/2015 15:58   US Transvaginal Non-ob  Result Date: 11/03/2015 CLINICAL DATA:  Heavy vaginal bleeding. EXAM: TRANSABDOMINAL AND TRANSVAGINAL ULTRASOUND OF PELVIS TECHNIQUE: Both transabdominal and transvaginal ultrasound examinations of the pelvis were performed. Transabdominal technique was performed for global imaging of the pelvis including uterus, ovaries, adnexal regions, and pelvic cul-de-sac. It was necessary to proceed with endovaginal exam following the transabdominal exam to visualize the uterus and endometrium. COMPARISON:  None FINDINGS: Uterus Measurements: 8.5 x 4.2 x 4.9 cm. No fibroids or other mass visualized. Endometrium Thickness: 6.2 mm.  No focal abnormality visualized. Right ovary Measurements: 3.3 x 1.7 x 2.0. Normal appearance/no adnexal mass. Left ovary Measurements: 2.3 x 1.9 x 2.1 cm. Normal appearance/no adnexal mass. Other findings Trace free fluid noted within the pelvis. IMPRESSION: 1. Unremarkable appearance of the uterus. If bleeding remains unresponsive to hormonal or medical therapy, sonohysterogram should be  considered for focal lesion work-up. (Ref: Radiological Reasoning: Algorithmic Workup of Abnormal Vaginal Bleeding with Endovaginal Sonography and Sonohysterography. AJR 2008; 161:W96-04) Electronically Signed   By: Signa Kell M.D.   On: 11/03/2015 15:22   US Pelvis Complete  Result Date: 11/03/2015 CLINICAL DATA:  Heavy vaginal bleeding. EXAM: TRANSABDOMINAL AND TRANSVAGINAL ULTRASOUND OF PELVIS TECHNIQUE: Both transabdominal and transvaginal ultrasound examinations of the pelvis were performed. Transabdominal technique was performed for global imaging of the pelvis including uterus, ovaries, adnexal regions, and pelvic cul-de-sac. It was necessary to proceed with endovaginal exam following the transabdominal exam to visualize the uterus and endometrium. COMPARISON:  None FINDINGS: Uterus Measurements: 8.5 x 4.2 x 4.9 cm. No fibroids or other mass visualized. Endometrium Thickness: 6.2 mm.  No focal abnormality visualized. Right ovary Measurements: 3.3 x 1.7 x 2.0. Normal appearance/no adnexal mass. Left ovary Measurements: 2.3 x 1.9 x 2.1 cm. Normal appearance/no adnexal mass. Other findings Trace free fluid noted within the pelvis. IMPRESSION: 1. Unremarkable appearance of the uterus. If bleeding remains unresponsive to hormonal or medical therapy, sonohysterogram should be considered for focal lesion work-up. (Ref: Radiological Reasoning: Algorithmic Workup of Abnormal Vaginal Bleeding  with Endovaginal Sonography and Sonohysterography. AJR 2008; 161:W96-04; 191:S68-73) Electronically Signed   By: Signa Kellaylor  Stroud M.D.   On: 11/03/2015 15:22    MAU Management/MDM: Ordered labs and imaging and reviewed results.  IUD in place as expected.  Discussed US with pt.  Plant to treat pain with Naproxen BID and Percocet 5/325, take 1-2 Q 6 hours PRN x 15 tabs.  [facility]. Pt stable at time of discharge.  ASSESSMENT 1. IUD complication, initial encounter   2. Complication of intrauterine device  (IUD) (HCC)   3. Acute pelvic pain, female     PLAN Discharge home   Medication List    STOP taking these medications   ibuprofen 600 MG tablet Commonly known as:  ADVIL,MOTRIN     TAKE these medications   ESTER C PO Take 1 tablet by mouth daily.   Garlic 1000 MG Caps Take 1 capsule by mouth daily.   megestrol 40 MG tablet Commonly known as:  MEGACE Take 1 tablet (40 mg total) by mouth 2 (two) times daily.   naproxen sodium 550 MG tablet Commonly known as:  ANAPROX DS Take 1 tablet (550 mg total) by mouth 2 (two) times daily with a meal.   oxyCODONE-acetaminophen 5-325 MG tablet Commonly known as:  PERCOCET/ROXICET Take 1-2 tablets by mouth every 6 (six) hours as needed for severe pain.   prenatal multivitamin Tabs tablet Take 1 tablet by mouth daily at 12 noon.   PROBIOTIC ACIDOPHILUS PO Take 1 capsule by mouth daily.   STRESS B COMPLEX PO Take 1 tablet by mouth daily.      Follow-up Information    [facility] .   Specialty:  Obstetrics and Gynecology Why:  As scheduled or call if sympoms persist or worsen. Return to MAU as needed for emergencies. Contact information: [address] [place] [id number] [phone]          Sharen CounterLisa Leftwich-Kirby Certified Nurse-Midwife 11/27/2015  4:49 PM

## 2015-11-27 NOTE — MAU Note (Signed)
IUD inserted on Tuesday. Has had increasing pain on and off since then. Has been able to feel strings the one time she checked. Wants to check that placement is correct.

## 2015-12-08 ENCOUNTER — Ambulatory Visit (INDEPENDENT_AMBULATORY_CARE_PROVIDER_SITE_OTHER): Payer: Medicaid Other | Admitting: Obstetrics and Gynecology

## 2015-12-08 ENCOUNTER — Other Ambulatory Visit (HOSPITAL_COMMUNITY)
Admission: RE | Admit: 2015-12-08 | Discharge: 2015-12-08 | Disposition: A | Discharge status: Home / Self Care | Payer: Medicaid Other | Source: Ambulatory Visit | Attending: Obstetrics and Gynecology | Admitting: Obstetrics and Gynecology

## 2015-12-08 ENCOUNTER — Encounter: Payer: Self-pay | Admitting: Obstetrics and Gynecology

## 2015-12-08 VITALS — BP 106/58 | HR 90 | Wt 108.9 lb

## 2015-12-08 DIAGNOSIS — Z8619 Personal history of other infectious and parasitic diseases: Secondary | ICD-10-CM

## 2015-12-08 DIAGNOSIS — N939 Abnormal uterine and vaginal bleeding, unspecified: Secondary | ICD-10-CM

## 2015-12-08 DIAGNOSIS — Z113 Encounter for screening for infections with a predominantly sexual mode of transmission: Secondary | ICD-10-CM | POA: Insufficient documentation

## 2015-12-08 DIAGNOSIS — Z30432 Encounter for removal of intrauterine contraceptive device: Secondary | ICD-10-CM | POA: Diagnosis not present

## 2015-12-08 DIAGNOSIS — Z8742 Personal history of other diseases of the female genital tract: Secondary | ICD-10-CM | POA: Insufficient documentation

## 2015-12-08 DIAGNOSIS — N760 Acute vaginitis: Secondary | ICD-10-CM | POA: Insufficient documentation

## 2015-12-08 DIAGNOSIS — R1030 Lower abdominal pain, unspecified: Secondary | ICD-10-CM | POA: Diagnosis not present

## 2015-12-08 LAB — POCT URINALYSIS DIP (DEVICE)
BILIRUBIN URINE: NEGATIVE
Glucose, UA: NEGATIVE mg/dL
KETONES UR: NEGATIVE mg/dL
Leukocytes, UA: NEGATIVE
NITRITE: NEGATIVE
PH: 7 (ref 5.0–8.0)
Protein, ur: NEGATIVE mg/dL
Specific Gravity, Urine: 1.025 (ref 1.005–1.030)
Urobilinogen, UA: 0.2 mg/dL (ref 0.0–1.0)

## 2015-12-08 MED ORDER — CLINDAMYCIN PHOSPHATE (1 DOSE) 2 % VA CREA: 1.0000 | TOPICAL_CREAM | Freq: Every day | VAGINAL | 0 refills | Status: AC

## 2015-12-08 NOTE — Progress Notes (Signed)
Obstetrics and Gynecology Visit Follow up MAU Evaluation  Appointment Date: 12/09/2015  OBGYN Clinic: [facility]  Primary Care Provider: Patient has PCP @ [facility]  Chief Complaint:  Chief Complaint  Patient presents with  . IUD CONCERNS    History of Present Illness: Jean Jensen is a 30 y.o. Caucasian [phone] (Patient's last menstrual period was 10/10/2015.), seen for the above chief complaint. Her past medical history is significant for h/o medically treated ectopic, recent CT and PID diagnosis with IUD insertion.  Patient seen in early August for pain and VB and diagnosed with chlamydia and BV. Patient treated with azithromycin and then seen on 8/13 for same complaints and diagnosed with PID and treated with IM rocephin and put on doxy course. Patient then seen in clinic on 9/12 for AUB f/u and had Liletta placed and no test of cure done, UPT negative.. Patient then seen on 9/15 in MAU for repeat s/s and had an u/s which was negative and showed the IUD to be in the proper place  Patient states s/s are better being on the bid megace which she was put on on 8/20 but still having pain (improved) and bleeding. No fevers, chills, dysuria, hematuria, nausea, vomiting, itching.   Patient states she had a negative pap at her PCP earlier this year.  Review of Systems: Her 12 point review of systems is negative or as noted in the History of Present Illness.  Past Medical History:  Past Medical History:  Diagnosis Date  . Abnormal Pap smear   . Depression   . History of chlamydia   . History of ectopic pregnancy    treated with MTX  . History of PID   . PONV (postoperative nausea and vomiting)     Past Surgical History:  Past Surgical History:  Procedure Laterality Date  . CESAREAN SECTION    . EYE SURGERY    . WISDOM TOOTH EXTRACTION      Past Obstetrical History:  OB History  Gravida Para Term Preterm AB Living  4 3 3   1 3   SAB TAB Ectopic  Multiple Live Births  0   1   3    # Outcome Date GA Lbr Len/2nd Weight Sex Delivery Anes PTL Lv  4 Ectopic           3 Term      VBAC   LIV  2 Term      VBAC   LIV  1 Term      CS-LTranv   LIV      Past Gynecological History: As per HPI.  Social History:  Social History   Social History  . Marital status: Legally Separated    Spouse name: N/A  . Number of children: N/A  . Years of education: N/A   Occupational History  . Not on file.   Social History Main Topics  . Smoking status: Current Every Day Smoker    Packs/day: 1.00    Years: 14.00    Types: Cigarettes  . Smokeless tobacco: Never Used  . Alcohol use Yes     Comment: 4 yrs ago  . Drug use:     Types: Marijuana, Cocaine, MDMA (Ecstacy)     Comment: 7 yrs ago, marijuana currently  . Sexual activity: Yes    Birth control/ protection: IUD   Other Topics Concern  . Not on file   Social History Narrative  . No narrative on file    Family  History:  Family History  Problem Relation Age of Onset  . Cancer Paternal Uncle   . Cancer Maternal Grandmother   . Cancer Maternal Grandfather   . Diabetes Paternal Grandmother   . Heart disease Paternal Grandmother   . Hypertension Paternal Grandmother   . COPD Paternal Grandmother     Medications PRN ibuprofen, megace  Allergies Penicillins  Physical Exam:  BP (!) 106/58   Pulse 90   Wt 108 lb 14.4 oz (49.4 kg)   LMP 10/10/2015   BMI 19.92 kg/m  Body mass index is 19.92 kg/m. General appearance: Well nourished, well developed female in no acute distress.  Neck:  Supple, normal appearance, and no thyromegaly  Cardiovascular: normal s1 and s2.  No murmurs, rubs or gallops. Respiratory:  Clear to auscultation bilateral. Normal respiratory effort Abdomen: positive bowel sounds and no masses, hernias; diffusely non tender to palpation, non distended Neuro/Psych:  Normal mood and affect.  Skin:  Warm and dry.  Lymphatic:  No inguinal lymphadenopathy.    Pelvic exam: is not limited by body habitus EGBUS: within normal limits, Vagina: within normal limits and with minimal old blood in the vault, Cervix: normal appearing cervix without discharge or lesions with IUD strings seen and no CMT. Uterus:  Nonenlarged, nttp, and Adnexa:  normal adnexa and no mass, fullness, tenderness Rectovaginal: deferred  See procedure note for IUD removal  Laboratory: UPT negative  Radiology: as above  Assessment: s/p IUD removal. Pt stable  Plan:  D/w pt that given her history that I recommended IUD removal, which was done today. STD TOC done and pt told to use condoms or abstain until patient seen back for one month follow up. She states she's used OCPs, depo in the past for her AUB, which was menorrhagia/dysmenorrhea and no intermenstrual bleeding, which is why the IUD was placed initially. I told she can stop the megace and to give her body time off hormones and to see her back in 4-6wks to see how she'd like to proceed, and to see if her most recent AUB was due to her recent CT infection. Options d/w her are other medical options and possible definitive surgical management; she states that she is done with childbearing.  RTC 4-6wks for f/u AUB discussion  [name], Jr MD Attending [facility](Faculty Practice)

## 2015-12-09 ENCOUNTER — Encounter: Payer: Self-pay | Admitting: Obstetrics and Gynecology

## 2015-12-09 LAB — CERVICOVAGINAL ANCILLARY ONLY
CHLAMYDIA, DNA PROBE: NEGATIVE
NEISSERIA GONORRHEA: NEGATIVE
TRICH (WINDOWPATH): NEGATIVE

## 2015-12-09 NOTE — Procedures (Signed)
Intrauterine Device (IUD) Removal Procedure Note  The patient understands the risks of IUD removal, which include but are not limited to: bleeding, infection.  The patient was placed in dorsal lithotomy and a Graves speculum inserted. Minimal old blood in the vault and IUD strings (3cm) seen coming from the external os. They were grasped with ringed forceps and the IUD was easily removed and noted to be intact. The patient tolerated the procedure well.   Cornelia Copaharlie Kherington Meraz, Jr MD Attending [facility](Faculty Practice)

## 2016-01-07 ENCOUNTER — Other Ambulatory Visit (HOSPITAL_COMMUNITY)
Admission: RE | Admit: 2016-01-07 | Discharge: 2016-01-07 | Disposition: A | Discharge status: Home / Self Care | Payer: Medicaid Other | Source: Ambulatory Visit | Attending: Obstetrics and Gynecology | Admitting: Obstetrics and Gynecology

## 2016-01-07 ENCOUNTER — Encounter: Payer: Self-pay | Admitting: Obstetrics and Gynecology

## 2016-01-07 ENCOUNTER — Ambulatory Visit (INDEPENDENT_AMBULATORY_CARE_PROVIDER_SITE_OTHER): Payer: Medicaid Other | Admitting: Obstetrics and Gynecology

## 2016-01-07 VITALS — BP 120/74 | HR 84 | Wt 110.7 lb

## 2016-01-07 DIAGNOSIS — Z202 Contact with and (suspected) exposure to infections with a predominantly sexual mode of transmission: Secondary | ICD-10-CM | POA: Diagnosis present

## 2016-01-07 DIAGNOSIS — Z113 Encounter for screening for infections with a predominantly sexual mode of transmission: Secondary | ICD-10-CM

## 2016-01-07 DIAGNOSIS — Z8619 Personal history of other infectious and parasitic diseases: Secondary | ICD-10-CM

## 2016-01-07 LAB — TSH: TSH: 1.21 mIU/L

## 2016-01-07 MED ORDER — LEVONORGESTREL 1.5 MG PO TABS: 1.5000 mg | ORAL_TABLET | Freq: Once | ORAL | 1 refills | Status: AC

## 2016-01-07 NOTE — Progress Notes (Signed)
Obstetrics and Gynecology Visit Return Patient Evaluation  Appointment Date: 01/07/2016  PCP: [facility]  Chief Complaint: follow up abdomino-pelvic pain and AUB  History of Present Illness:  Jean Jensen is a 30 y.o. Caucasian [phone] (Patient's last menstrual period was 10/10/2015.), seen for the above chief complaint. Her past medical history is significant for h/o medically treated ectopic, recent CT and PID diagnosis with IUD insertion.  Patient seen in early August for pain and VB and diagnosed with chlamydia and BV. Patient treated with azithromycin and then seen on 8/13 for same complaints and diagnosed with PID and treated with IM rocephin and put on doxy course. Patient then seen in clinic on 9/12 for AUB f/u and had Liletta placed and no test of cure done, UPT negative.. Patient then seen on 9/15 in MAU for repeat s/s and had an u/s which was negative and showed the IUD to be in the proper place  Patient states s/s are better being on the bid megace which she was put on on 8/20 but still having pain (improved) and bleeding. No fevers, chills, dysuria, hematuria, nausea, vomiting, itching.   Patient states she had a negative pap at her PCP earlier this year  Interval History: Since last visit on 9/26, patient states that her AUB she believes may be gone b/c she believes her period just ended and prior to that she hadn't had any bleeding for about two weeks before that. She states that her partner tested + for CT two weeks ago and they did use a condom but she would like to get another test. Her TOC from last visit was negative.   No fevers, chills, abdominal pain, nausea, vomiting, VB or discharge, dysuria, issues with PO  Review of Systems: her 12 point review of systems is negative or as noted in the History of Present Illness.  Medications: None  Allergies: is allergic to penicillins.  Physical Exam:  BP 120/74   Pulse 84   Wt 110 lb 11.2 oz (50.2 kg)   LMP  01/04/2016   BMI 20.25 kg/m  Body mass index is 20.25 kg/m. General appearance: Well nourished, well developed female in no acute distress.  Abdomen: diffusely non tender to palpation, non distended, and no masses, hernias Neuro/Psych:  Normal mood and affect.    Pelvic exam:  EGBUS normal   Assessment: Patient doing well  Plan: *AUB and pelvic pain: d/w her that it seems like her body is getting back to normal since her AUB seems to be resolving the pelvic pain is going away. I did tell her that we recommend rpt GC/CT testing in 2679m to ensure no reinfection. I also told her that even though her AUB, which is why she had the mirena placed, seems to be resolving, she does [facility] if she doesn't want to get pregnant. She denies anything today, as she'd like to be hormone free for awhile and will continue to use condoms in the interim and Plan B was sent in for her. I told her that if her s/s come back to call us and let us know since she's tried OCPs and depo in the past, along with a mirena most recently and if she's done with childbearing that consideration for surgery should be given and would recommend a hyst given the fact that her bleeding is irregular and an ablation may not get rid of irregular bleeding and she'd still need something for [facility] after an ablation. Patient to follow up  s/s and to let us know if she'd like surgical intervention in the future.   *STI: GC/CT/trich swab done today and pt amenable to serum testing today, too.   RTC PRN  Cornelia Copa MD Attending [facility])

## 2016-01-07 NOTE — Patient Instructions (Signed)
Levonorgestrel emergency contraceptive kit What is this medicine? LEVONORGESTREL (LEE voe nor jes trel) is an emergency contraceptive (birth control pill). It prevents pregnancy if taken within the 72 hours after unprotected sex. This medicine will not work if you are already pregnant. This medicine may be used for other purposes; ask your health care provider or pharmacist if you have questions. What should I tell my health care provider before I take this medicine? They need to know if you have or ever had any of these conditions: -blood sugar problems, like diabetes -cancer of the breast, cervix, ovary, uterus, vagina, or unusual vaginal bleeding -fibroids -liver disease -menstrual problems -migraine headaches -an unusual or allergic reaction to levonorgestrel, other medicines, foods, dyes, or preservatives -pregnant or trying to get pregnant -breast-feeding How should I use this medicine? Take this medicine by mouth. Your doctor may want you to use a quick-response pregnancy test prior to using the tablets. Take your medicine as soon as you can after having unprotected sex, preferably in the first 24 hours, but no later than 72 hours (3 days) after the event. Follow the dose instructions of your health care provider exactly. Do not take any extra pills. Extra pills will not decrease your risk of pregnancy, but may increase your risk of side effects. A patient package insert for the product will be given with each prescription and refill. Read this sheet carefully each time. The sheet may change frequently. Contact your pediatrician regarding the use of this medicine in children. Special care may be needed. This medicine has been used in female children who have started having menstrual periods. Overdosage: If you think you have taken too much of this medicine contact a poison control center or emergency room at once. NOTE: This medicine is only for you. Do not share this medicine with  others. What if I miss a dose? If you miss a dose or vomit within 1 hour of taking your dose, you MUST contact your health care professional for instructions. What may interact with this medicine? Do not take this medicine with any of the following medications: -amprenavir -bosentan -fosamprenavir This medicine may also interact with the following medications: -aprepitant -barbiturate medicines for inducing sleep or treating seizures -bexarotene -griseofulvin -medicines to treat seizures like carbamazepine, ethotoin, felbamate, oxcarbazepine, phenytoin, topiramate -modafinil -pioglitazone -rifabutin -rifampin -rifapentine -some medicines to treat HIV infection like atazanavir, indinavir, lopinavir, nelfinavir, tipranavir, ritonavir -St. John's wort -warfarin This list may not describe all possible interactions. Give your health care provider a list of all the medicines, herbs, non-prescription drugs, or dietary supplements you use. Also tell them if you smoke, drink alcohol, or use illegal drugs. Some items may interact with your medicine. What should I watch for while using this medicine? Emergency birth control is not to be used routinely to prevent pregnancy. Discuss birth control options with your health care provider. Make a follow-up appointment to see your health care provider in 3 to 4 weeks after using this medicine. It is common to have spotting after using this medicine. If you miss your next period, the possibility of pregnancy must be considered. See your health care professional as soon as you can and get a pregnancy test. Smoking increases the risk of getting a blood clot or having a stroke while you are taking birth control pills, especially if you are more than 32 years old. You are strongly advised not to smoke. This medicine does not protect you against HIV infection (AIDS) or any other sexually  transmitted diseases. What side effects may I notice from receiving this  medicine? Side effects that you should report to your doctor or health care professional as soon as possible: -Severe side effects are not common. However, the potential for severe side effects may exist and you may want to discuss these with your health care provider. Side effects that usually do not require medical attention (report to your doctor or health care professional if they continue or are bothersome): -abdominal pain or cramping -breast tenderness -dizziness -nausea -spotting This list may not describe all possible side effects. Call your doctor for medical advice about side effects. You may report side effects to FDA at 1-800-FDA-1088. Where should I keep my medicine? Keep out of the reach of children. Store at room temperature between 15 and 30 degrees C (59 and 86 degrees F). Throw away any unused medicine after the expiration date. NOTE: This sheet is a summary. It may not cover all possible information. If you have questions about this medicine, talk to your doctor, pharmacist, or health care provider.    2016, Elsevier/Gold Standard. (2008-02-14 13:51:24)

## 2016-01-07 NOTE — Addendum Note (Signed)
Addended by: Kathee DeltonHILLMAN, CARRIE L on: 01/07/2016 04:58 PM   Modules accepted: Orders

## 2016-01-08 LAB — GC/CHLAMYDIA PROBE AMP (~~LOC~~) NOT AT ARMC
Chlamydia: POSITIVE — AB
Neisseria Gonorrhea: NEGATIVE

## 2016-01-08 LAB — HEPATITIS C ANTIBODY: HCV Ab: NEGATIVE

## 2016-01-08 LAB — HEPATITIS B SURFACE ANTIGEN: Hepatitis B Surface Ag: NEGATIVE

## 2016-01-08 LAB — HIV ANTIBODY (ROUTINE TESTING W REFLEX): HIV: NONREACTIVE

## 2016-01-08 LAB — RPR

## 2016-01-11 ENCOUNTER — Telehealth: Payer: Self-pay | Admitting: *Deleted

## 2016-01-11 DIAGNOSIS — A749 Chlamydial infection, unspecified: Secondary | ICD-10-CM

## 2016-01-11 MED ORDER — AZITHROMYCIN 250 MG PO TABS: 1000.0000 mg | ORAL_TABLET | Freq: Once | ORAL | 0 refills | Status: AC

## 2016-01-11 NOTE — Telephone Encounter (Signed)
Called patient to inform her of positive chlamydia culture. Advised to abstain from intercourse and to be sure that her partner is also treated. Patient will pick up rx and inform partner. She had no further questions or concerns.

## 2016-01-11 NOTE — Telephone Encounter (Signed)
-----   Message from Brooks Bingharlie Pickens, MD sent at 01/09/2016  3:54 PM EDT ----- Can you call her and send her in tx per protocol? thanks

## 2016-06-17 ENCOUNTER — Ambulatory Visit (INDEPENDENT_AMBULATORY_CARE_PROVIDER_SITE_OTHER): Payer: Medicaid Other | Admitting: *Deleted

## 2016-06-17 ENCOUNTER — Inpatient Hospital Stay (HOSPITAL_COMMUNITY)
Admission: AD | Admit: 2016-06-17 | Discharge: 2016-06-17 | Disposition: A | Discharge status: Home / Self Care | Payer: Medicaid Other | Source: Ambulatory Visit | Attending: Obstetrics & Gynecology | Admitting: Obstetrics & Gynecology

## 2016-06-17 DIAGNOSIS — Z3202 Encounter for pregnancy test, result negative: Secondary | ICD-10-CM

## 2016-06-17 DIAGNOSIS — R635 Abnormal weight gain: Secondary | ICD-10-CM | POA: Insufficient documentation

## 2016-06-17 DIAGNOSIS — R110 Nausea: Secondary | ICD-10-CM | POA: Diagnosis present

## 2016-06-17 DIAGNOSIS — N926 Irregular menstruation, unspecified: Secondary | ICD-10-CM | POA: Insufficient documentation

## 2016-06-17 DIAGNOSIS — Z3200 Encounter for pregnancy test, result unknown: Secondary | ICD-10-CM

## 2016-06-17 LAB — POCT PREGNANCY, URINE: PREG TEST UR: NEGATIVE

## 2016-06-17 NOTE — Progress Notes (Signed)
Pt informed of negative pregnancy test. She states that she is 6 days late and while she does sometimes have irregular periods, they are never late. She reports that when she had a previous ectopic pregnancy, the UPT was negative. She endorses sx of pregnancy including nausea and slight abdominal cramping. Beta quant performed today.  Pt will be notified of results next week. Ectopic precautions given.  Pt voiced understanding.

## 2016-06-17 NOTE — MAU Note (Signed)
Period is a week late. HPTs have been negative.  Last 3 months periods have been coming early  Hx of irreg cycles.  Hx of ectopic preg, with neg preg test.  Has been tired and nauseated, gaining weight.. Denies bleeding or pain.

## 2016-06-17 NOTE — MAU Provider Note (Signed)
Patient seen in Triage. She reports she is 6 days late for her period. She had IUD in place for the past several years until 3 months ago. Since she has been having regular 3-4 wks cycles. She reports nausea and weight gain. She has no vaginal bleeding abdominal pain or cramping. She had negative UPT at home. She is concerned because hse has history of ectopic. No acute concerns for ectopic today. No abdominal pain. Patient sent to Clinic for UPT.  Ernestina Penna, MD 06/17/16 1002

## 2016-06-18 LAB — BETA HCG QUANT (REF LAB): hCG Quant: <1 m[IU]/mL

## 2016-06-20 ENCOUNTER — Telehealth: Payer: Self-pay

## 2016-06-20 NOTE — Telephone Encounter (Signed)
-----   Message from Tereso Newcomer, MD sent at 06/20/2016  9:12 AM EDT ----- Patient is not pregnant.  Please call to inform patient of results.

## 2016-06-20 NOTE — Telephone Encounter (Signed)
Patient has been informed of test results.  

## 2016-10-20 DIAGNOSIS — R079 Chest pain, unspecified: Secondary | ICD-10-CM | POA: Diagnosis not present

## 2016-12-24 IMAGING — US US TRANSVAGINAL NON-OB
1 series · 15 of 25 positions shown · non-contrast
Comparison: None

CLINICAL DATA: Heavy vaginal bleeding.

EXAM:
TRANSABDOMINAL AND TRANSVAGINAL ULTRASOUND OF PELVIS
TECHNIQUE: Both transabdominal and transvaginal ultrasound examinations of the
pelvis were performed. Transabdominal technique was performed for
global imaging of the pelvis including uterus, ovaries, adnexal
regions, and pelvic cul-de-sac. It was necessary to proceed with
endovaginal exam following the transabdominal exam to visualize the
uterus and endometrium.

[Series 1: us transvaginal non-ob · 95 acquisitions, 15 frames shown]
[im 1/95]
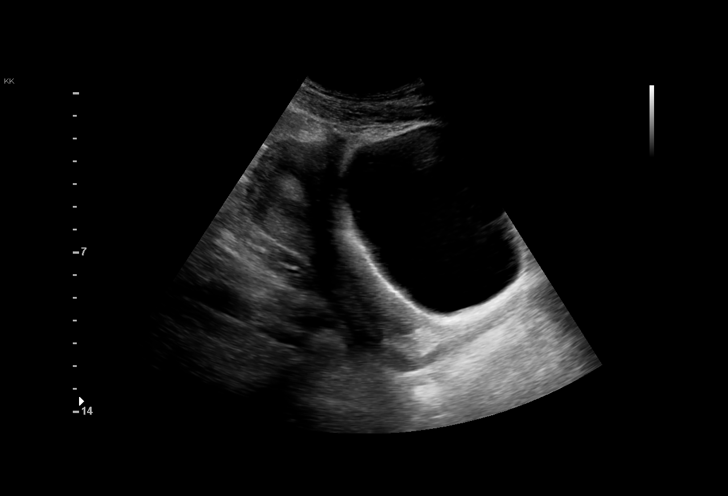
[im 8/95]
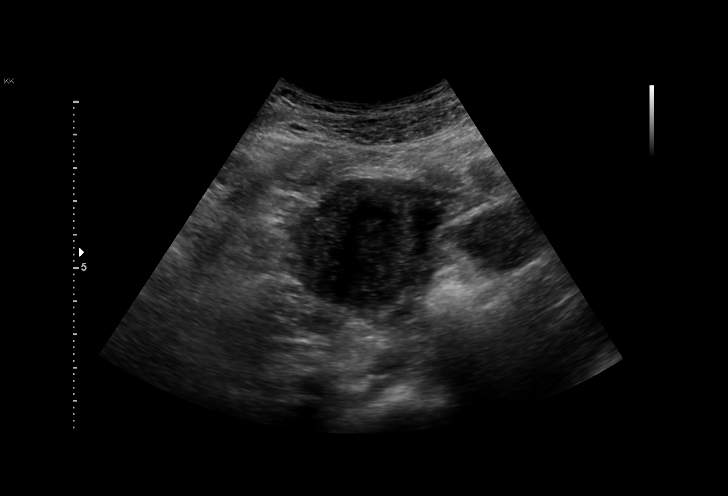
[im 16/95]
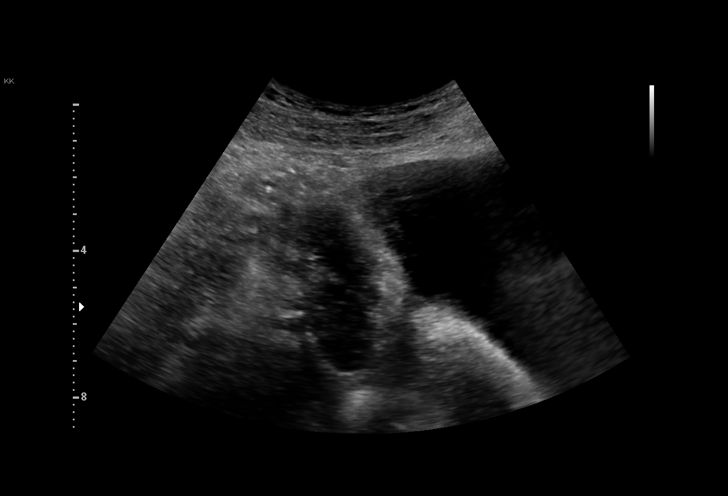
[im 20/95]
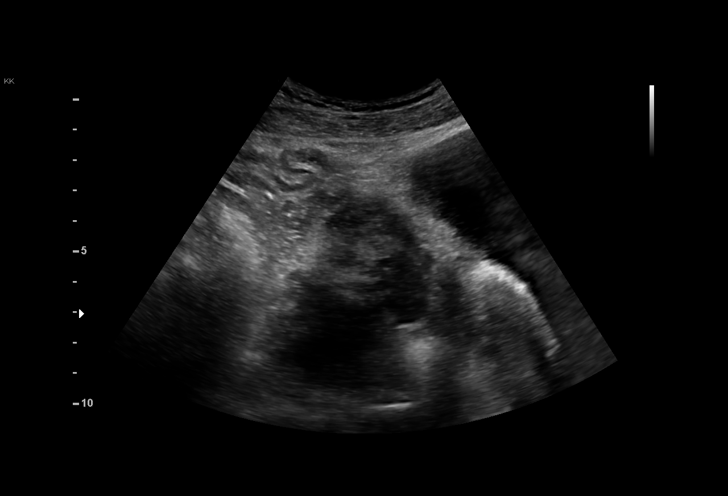
[im 28/95]
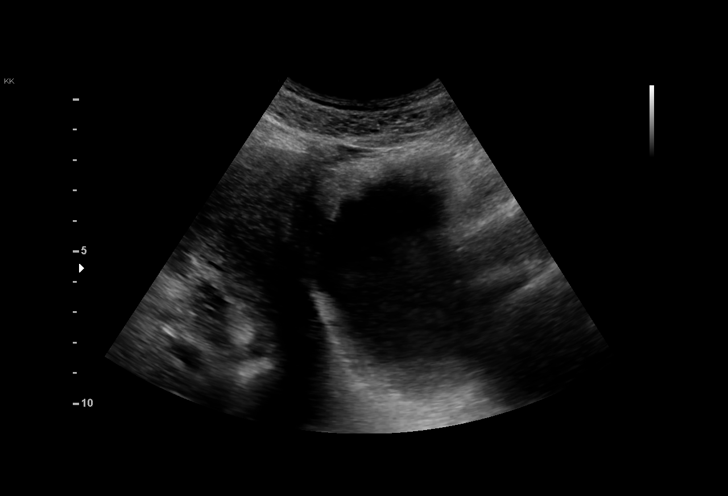
[im 36/95]
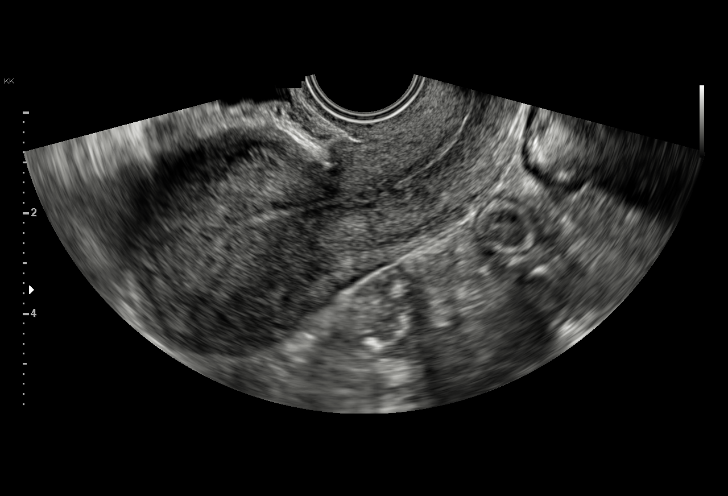
[im 40/95]
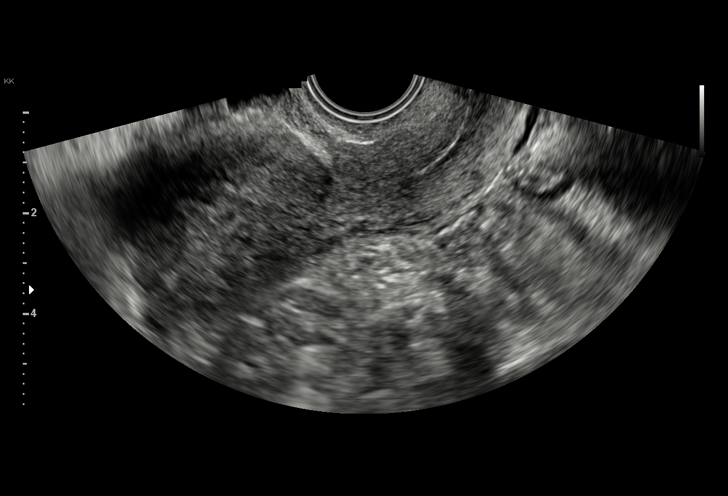
[im 48/95]
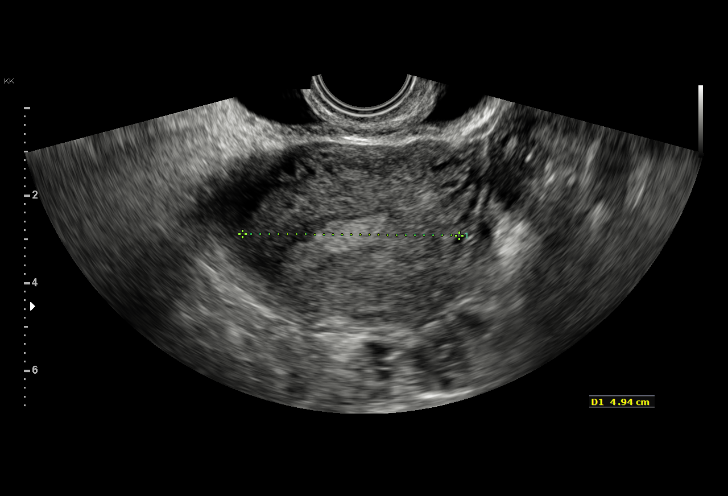
[im 55/95]
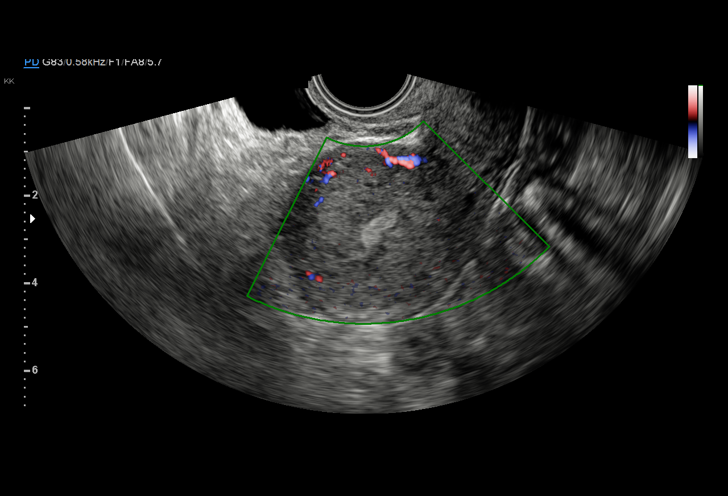
[im 59/95]
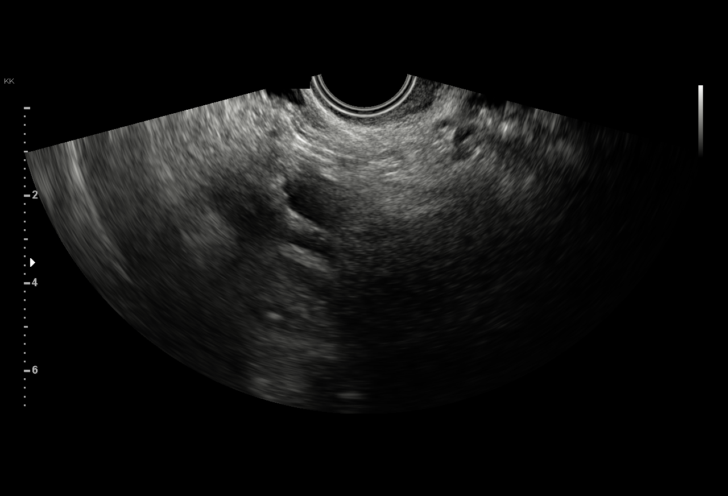
[im 67/95]
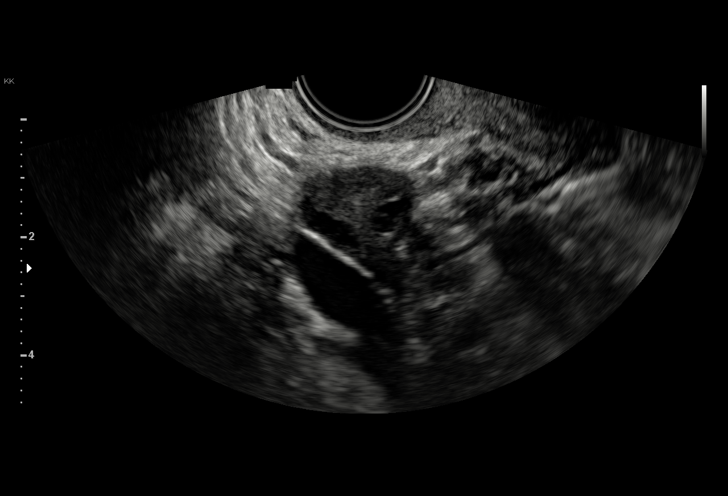
[im 75/95]
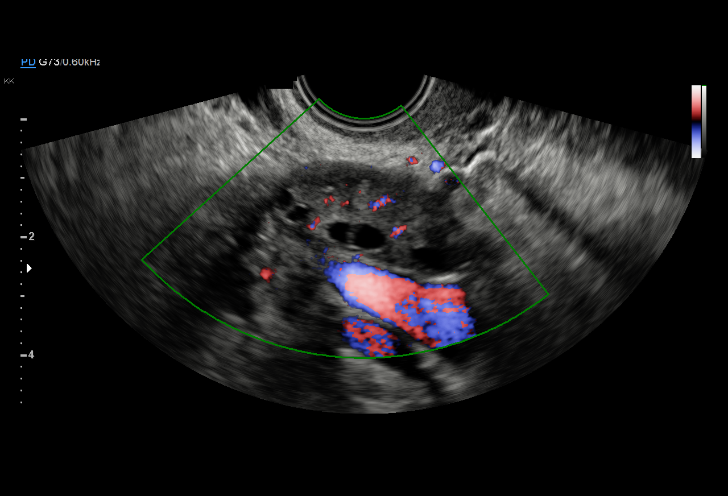
[im 79/95]
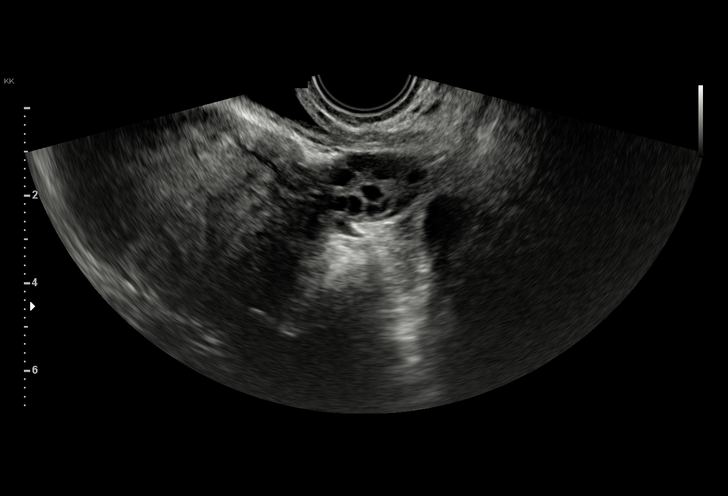
[im 87/95]
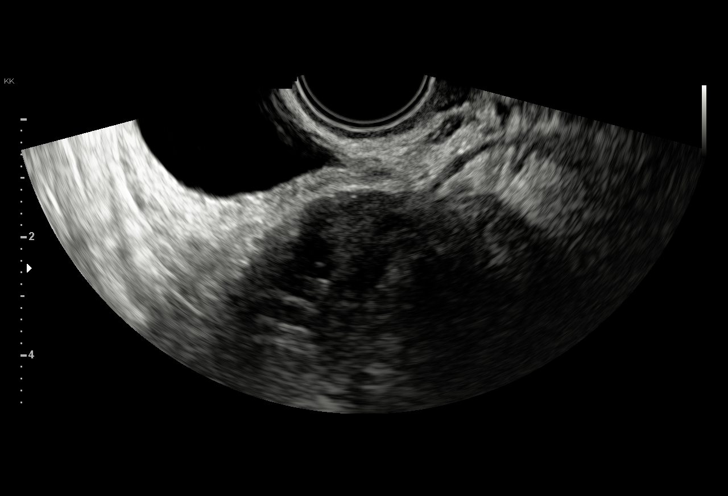
[im 95/95]
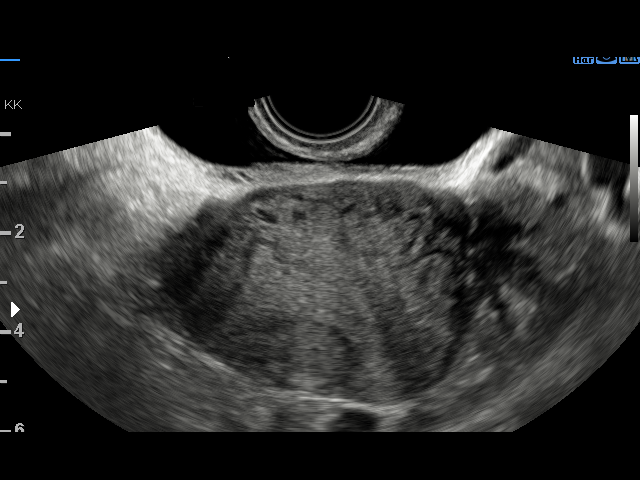

[15 of 25 positions shown; findings below may reference images not displayed]

FINDINGS: Uterus

Measurements: 8.5 x 4.2 x 4.9 cm. No fibroids or other mass
visualized.

Endometrium

Thickness: 6.2 mm.  No focal abnormality visualized.

Right ovary

Measurements: 3.3 x 1.7 x 2.0. Normal appearance/no adnexal mass.

Left ovary

Measurements: 2.3 x 1.9 x 2.1 cm. Normal appearance/no adnexal mass.

Other findings

Trace free fluid noted within the pelvis.
IMPRESSION: 1. Unremarkable appearance of the uterus. If bleeding remains
unresponsive to hormonal or medical therapy, sonohysterogram should
be considered for focal lesion work-up. (Ref: Radiological
Reasoning: Algorithmic Workup of Abnormal Vaginal Bleeding with
Endovaginal Sonography and Sonohysterography. AJR [id number]; [id number])

## 2017-01-17 IMAGING — US US TRANSVAGINAL NON-OB
1 series · 15 of 25 positions shown · non-contrast
Comparison: 11/03/2015

CLINICAL DATA: Pelvic pain.  Recent IUD placement.

EXAM:
ULTRASOUND PELVIS TRANSVAGINAL
TECHNIQUE: Transvaginal ultrasound examination of the pelvis was performed
including evaluation of the uterus, ovaries, adnexal regions, and
pelvic cul-de-sac.

[Series 1: us transvaginal non-ob · 15 of 26 slices shown]
[im 1/26]
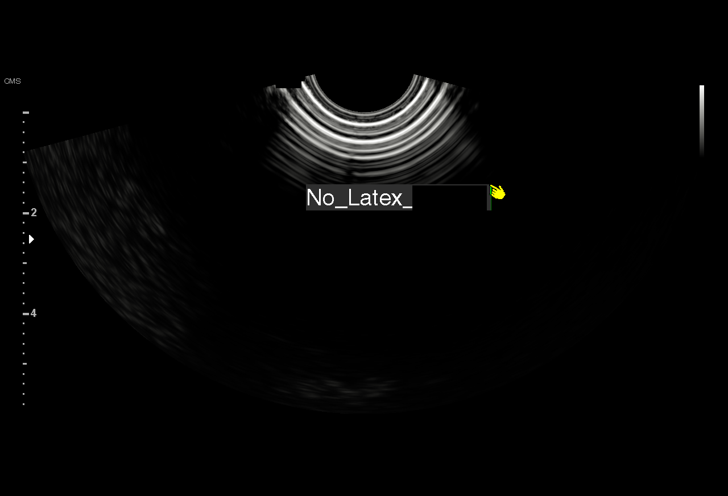
[im 3/26]
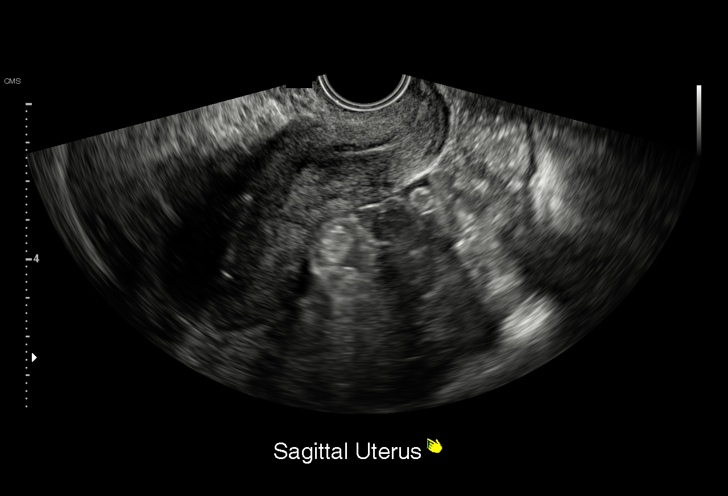
[im 5/26]
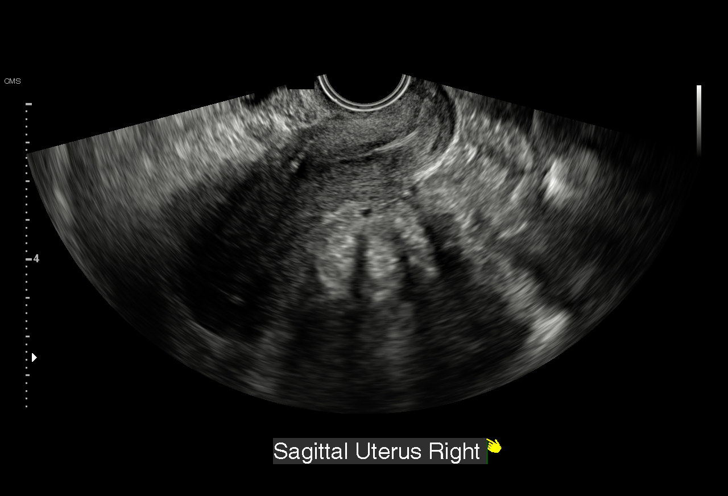
[im 6/26]
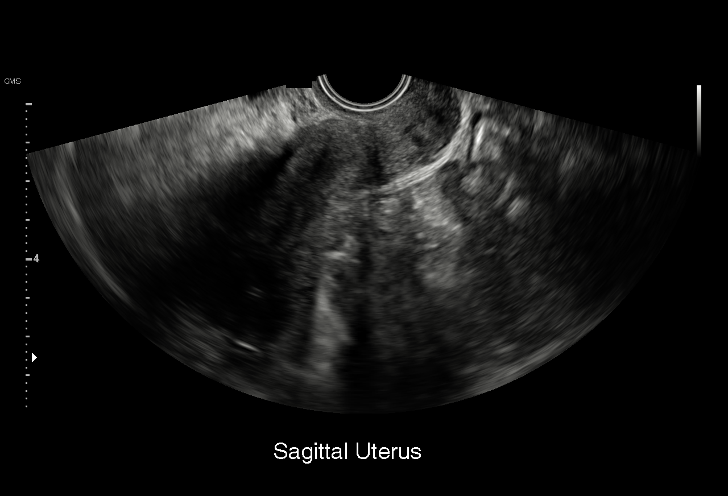
[im 8/26]
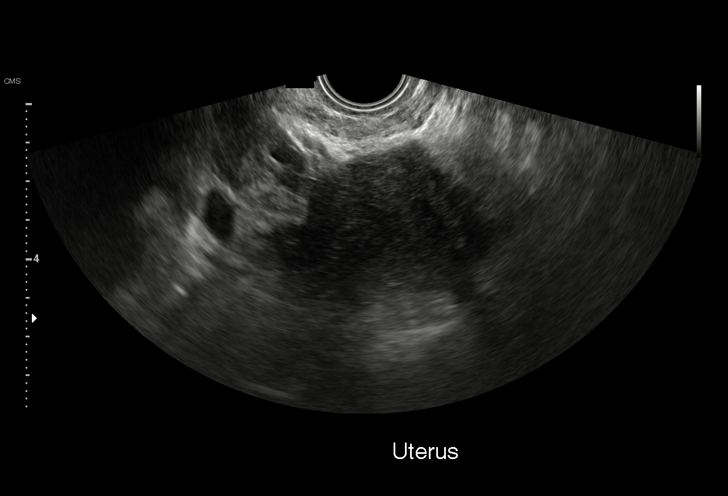
[im 10/26]
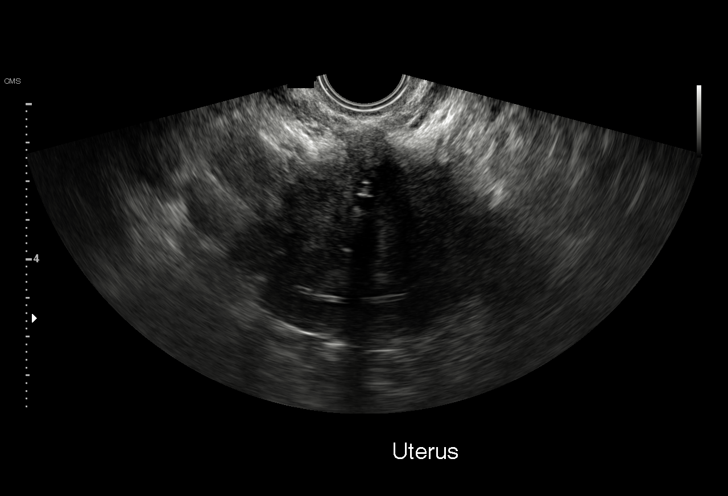
[im 11/26]
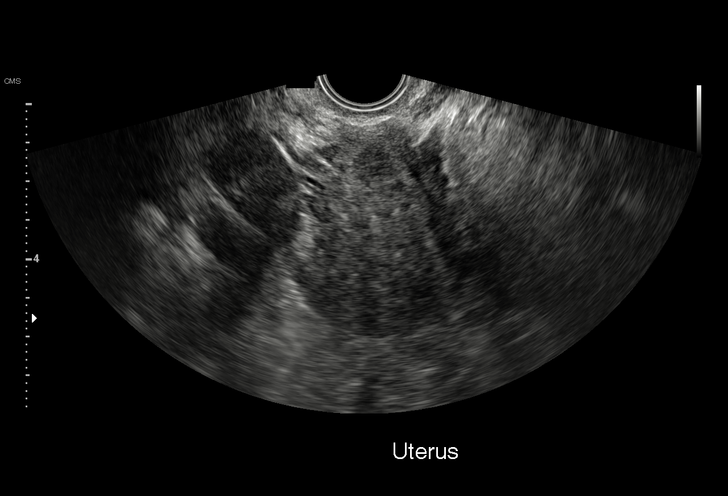
[im 13/26]
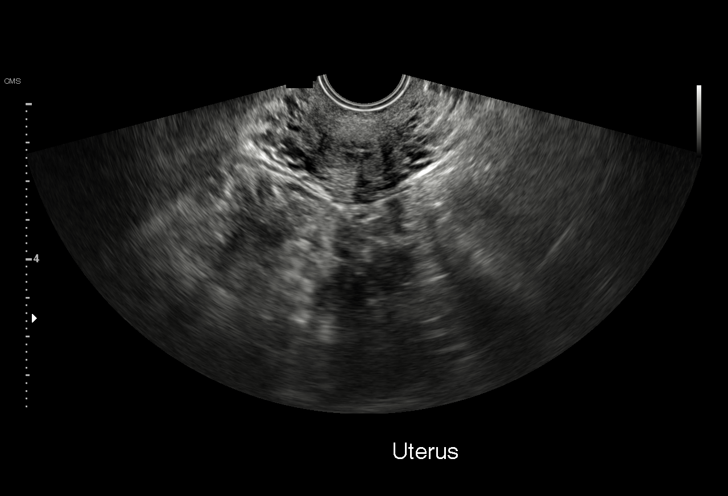
[im 15/26]
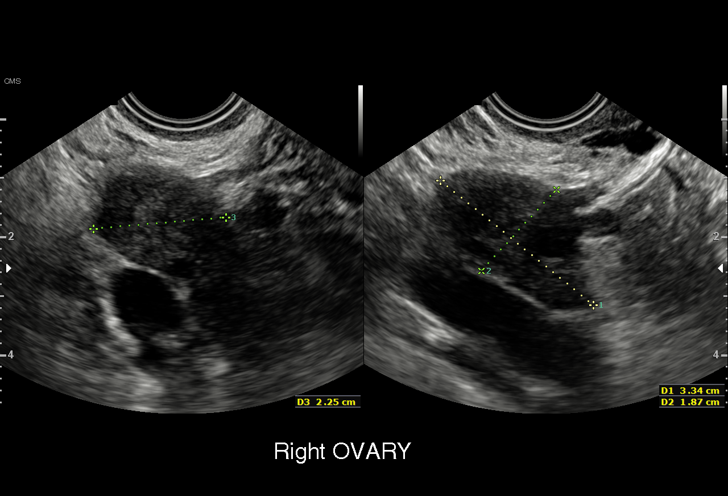
[im 16/26]
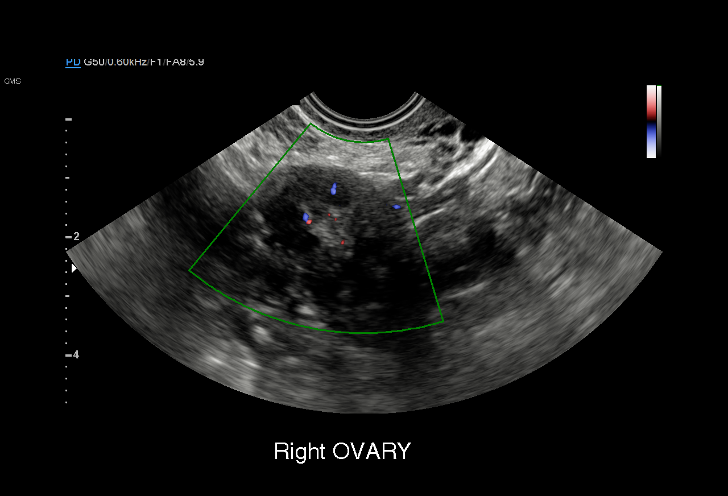
[im 18/26]
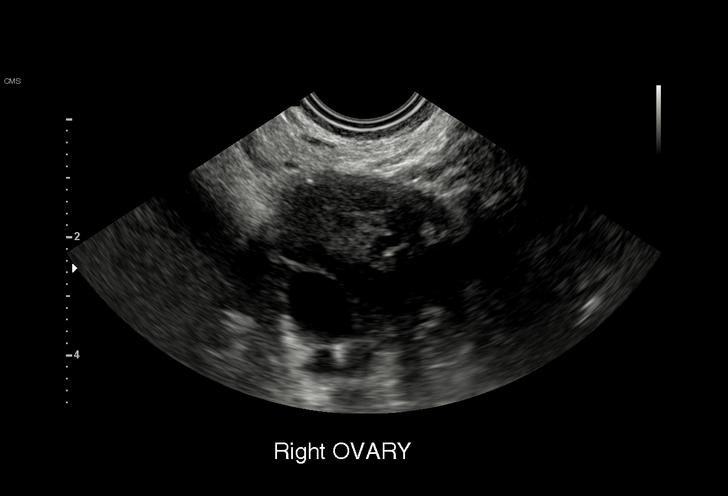
[im 20/26]
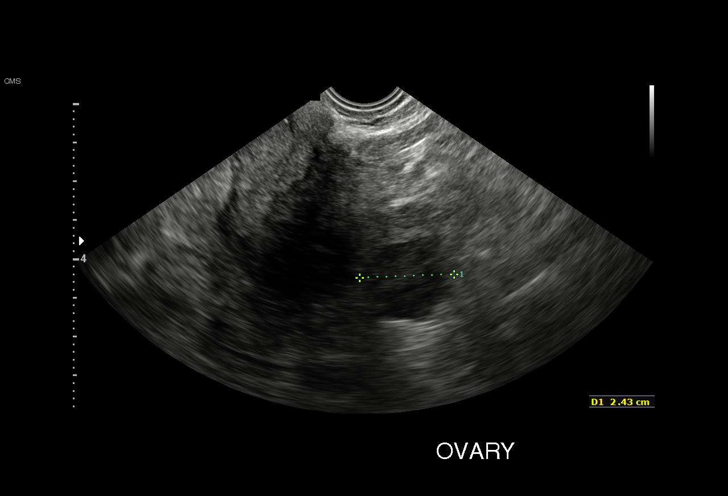
[im 21/26]
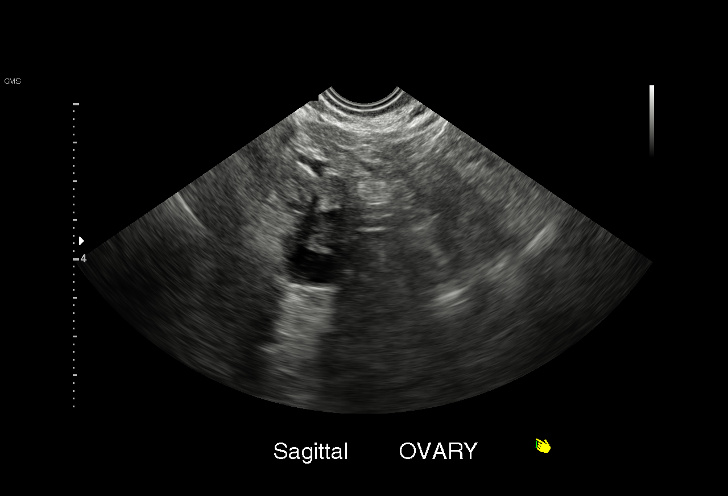
[im 23/26]
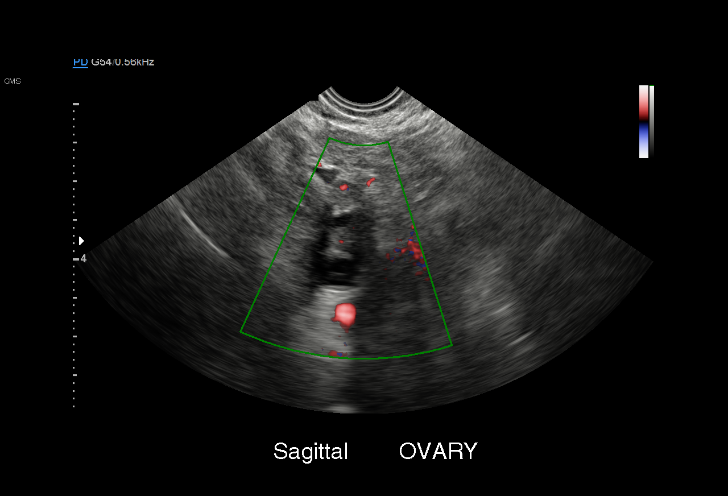
[im 26/26]
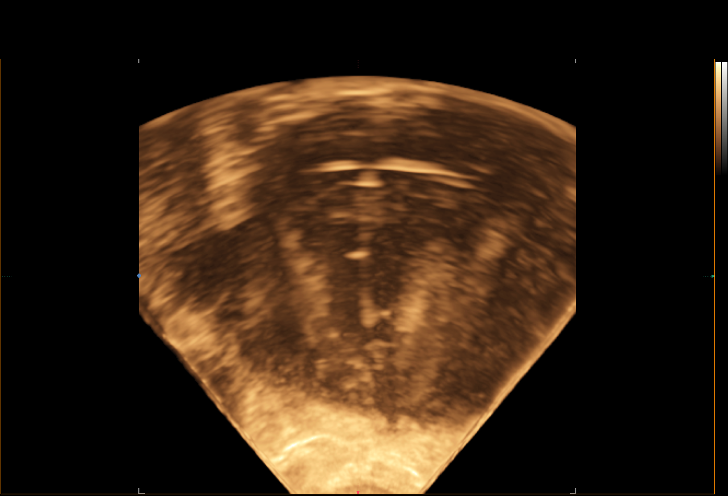

[15 of 25 positions shown; findings below may reference images not displayed]

FINDINGS: Uterus

Measurements: 9.0 x 3.7 x 4.8 cm. No fibroids or other mass
visualized.

Endometrium

Thickness: Not well visualized. IUD is in place in expected
position.

Right ovary

Measurements: 3.3 x 2.3 x 1.9 cm. Normal appearance/no adnexal mass.

Left ovary

Measurements: 2.6 x 2.4 x 1.9 cm. Normal appearance/no adnexal mass.

Other findings:  No abnormal free fluid
IMPRESSION: IUD in expected position within the endometrial cavity.
